# Patient Record
Sex: Female | Born: 1975
Health system: Southern US, Community
[De-identification: ages and names within clinical notes are randomized; demographics above are authoritative.]

## PROBLEM LIST (undated history)

## (undated) DIAGNOSIS — R011 Cardiac murmur, unspecified: Secondary | ICD-10-CM

## (undated) DIAGNOSIS — F32A Depression, unspecified: Secondary | ICD-10-CM

## (undated) DIAGNOSIS — K760 Fatty (change of) liver, not elsewhere classified: Secondary | ICD-10-CM

## (undated) DIAGNOSIS — F319 Bipolar disorder, unspecified: Secondary | ICD-10-CM

## (undated) DIAGNOSIS — F4310 Post-traumatic stress disorder, unspecified: Secondary | ICD-10-CM

## (undated) DIAGNOSIS — F329 Major depressive disorder, single episode, unspecified: Secondary | ICD-10-CM

---
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
## 2004-08-16 ENCOUNTER — Emergency Department (HOSPITAL_COMMUNITY): Admission: EM | Admit: 2004-08-16 | Discharge: 2004-08-17 | Payer: Self-pay | Admitting: Emergency Medicine

## 2004-08-18 ENCOUNTER — Emergency Department (HOSPITAL_COMMUNITY): Admission: EM | Admit: 2004-08-18 | Discharge: 2004-08-18 | Payer: Self-pay | Admitting: Family Medicine

## 2004-09-13 ENCOUNTER — Emergency Department (HOSPITAL_COMMUNITY): Admission: EM | Admit: 2004-09-13 | Discharge: 2004-09-13 | Payer: Self-pay | Admitting: Emergency Medicine

## 2004-10-05 ENCOUNTER — Inpatient Hospital Stay (HOSPITAL_COMMUNITY): Admission: AD | Admit: 2004-10-05 | Discharge: 2004-10-05 | Payer: Self-pay | Admitting: Obstetrics and Gynecology

## 2004-10-11 ENCOUNTER — Inpatient Hospital Stay (HOSPITAL_COMMUNITY): Admission: AD | Admit: 2004-10-11 | Discharge: 2004-10-11 | Payer: Self-pay | Admitting: Obstetrics & Gynecology

## 2004-12-16 ENCOUNTER — Emergency Department (HOSPITAL_COMMUNITY): Admission: EM | Admit: 2004-12-16 | Discharge: 2004-12-16 | Payer: Self-pay | Admitting: Emergency Medicine

## 2005-09-12 ENCOUNTER — Emergency Department (HOSPITAL_COMMUNITY): Admission: EM | Admit: 2005-09-12 | Discharge: 2005-09-12 | Payer: Self-pay | Admitting: Emergency Medicine

## 2005-09-27 ENCOUNTER — Other Ambulatory Visit: Admission: RE | Admit: 2005-09-27 | Discharge: 2005-09-27 | Payer: Self-pay | Admitting: Obstetrics and Gynecology

## 2005-12-06 ENCOUNTER — Ambulatory Visit (HOSPITAL_COMMUNITY): Admission: RE | Admit: 2005-12-06 | Discharge: 2005-12-06 | Payer: Self-pay | Admitting: Obstetrics and Gynecology

## 2006-02-08 ENCOUNTER — Ambulatory Visit: Payer: Self-pay

## 2006-02-11 IMAGING — US US OB TRANSVAGINAL MODIFY
1 series · 18 of 28 positions shown · non-contrast
Comparison: none

<!--  IDXRADR:ADDEND:BEGIN -->Addendum Begins<!--  IDXRADR:ADDEND:INNER_BEGIN -->DUPICATE COPY for exam association in [facility].  No change to original report ? 10/11/04

 <!--  IDXRADR:ADDEND:INNER_END -->Addendum Ends
<!--  IDXRADR:ADDEND:END -->Clinical Data:    28-year-old female, approximately 6 weeks pregnant with vaginal spotting.
 TRANSABDOMINAL AND TRANSVAGINAL PELVIC ULTRASOUND:
 No comparisons.
 Within the uterus, there is a gestational sac with a mean sac diameter of 16.3 mm, correlating with a 6 week 4 day pregnancy.  There is a visualized yolk sac.  Amnion is visualized.  No embryo or fetal pole is visualized at this point.  No subchorionic hemorrhage.  The right ovary is normal with a corpus luteum cyst.  The left ovary is unremarkable.  There is a small amount of free fluid.  
 By last menstrual period, the gestational age is estimated at approximately 9 weeks 3 days.

[Series 1: us ob comp<14 wk · 18 of 49 slices shown]
[im 1/49]
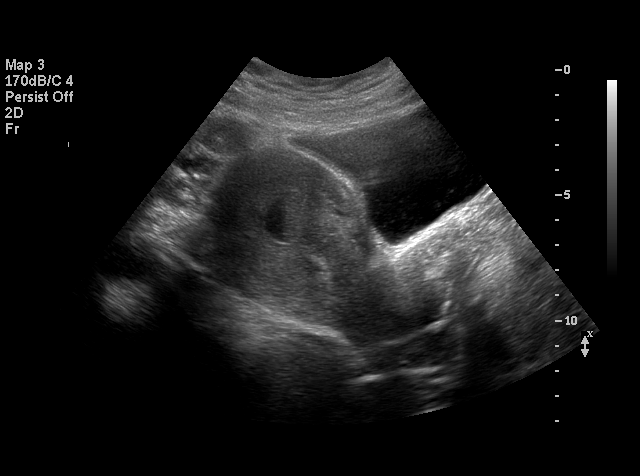
[im 4/49]
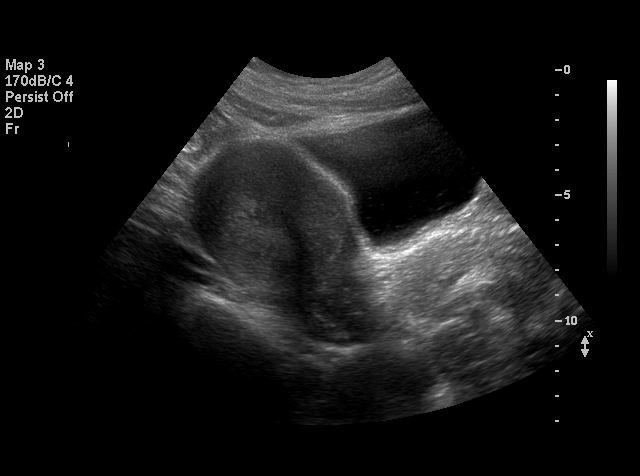
[im 6/49]
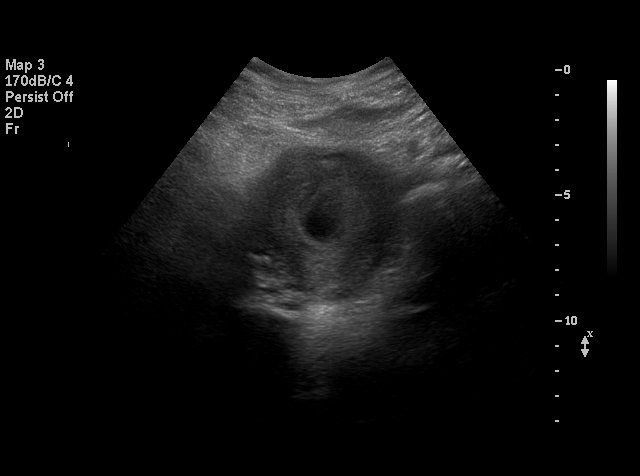
[im 9/49]
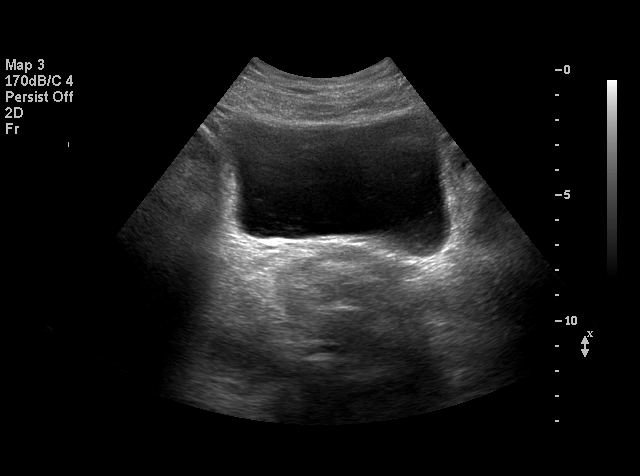
[im 13/49]
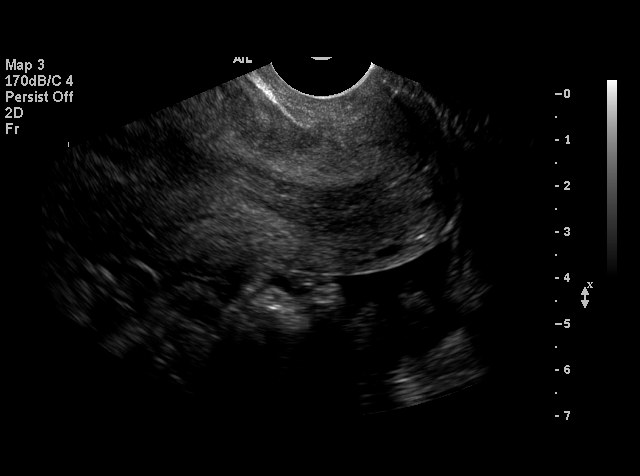
[im 15/49]
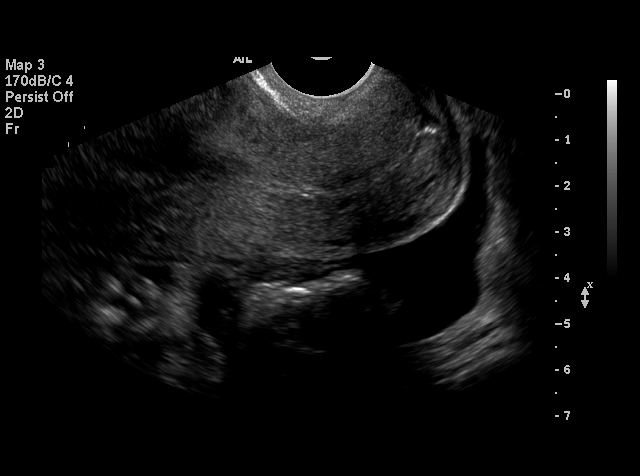
[im 18/49]
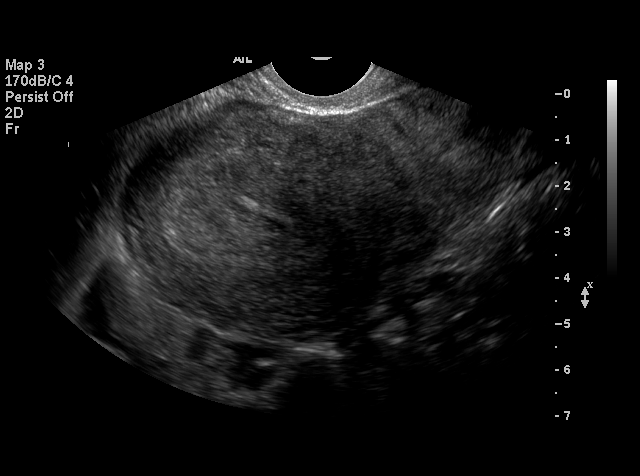
[im 20/49]
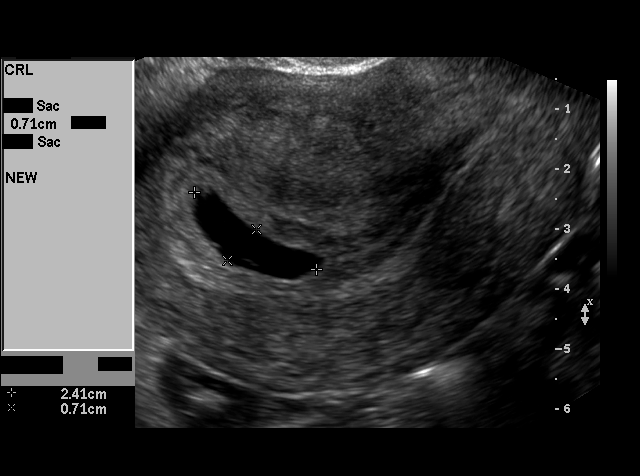
[im 24/49]
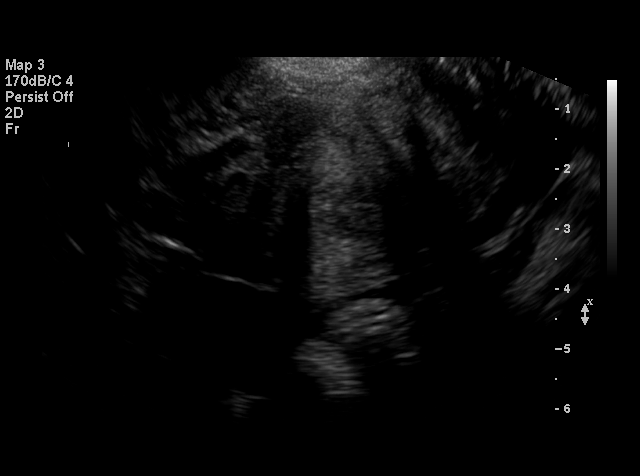
[im 25/49]
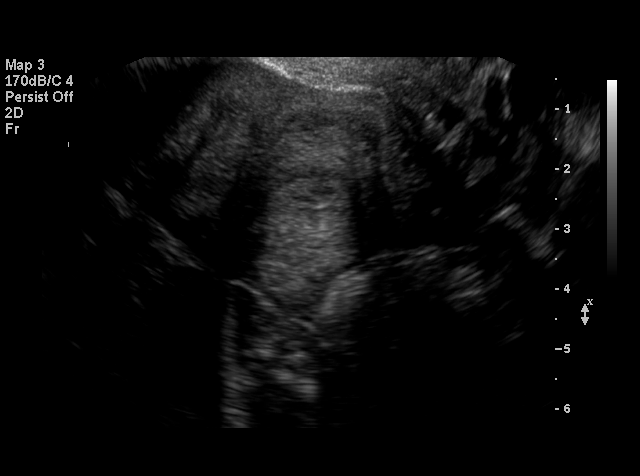
[im 29/49]
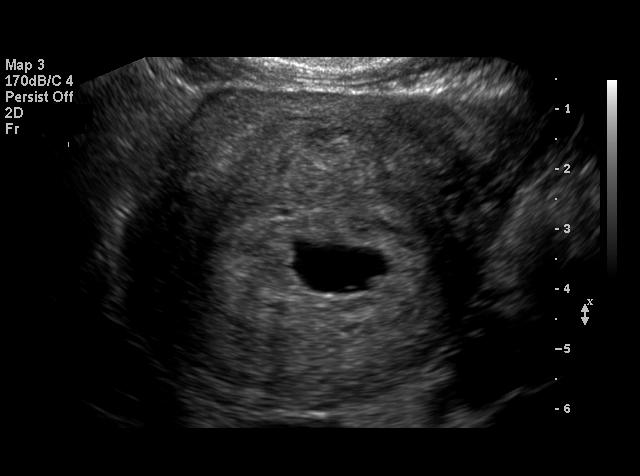
[im 31/49]
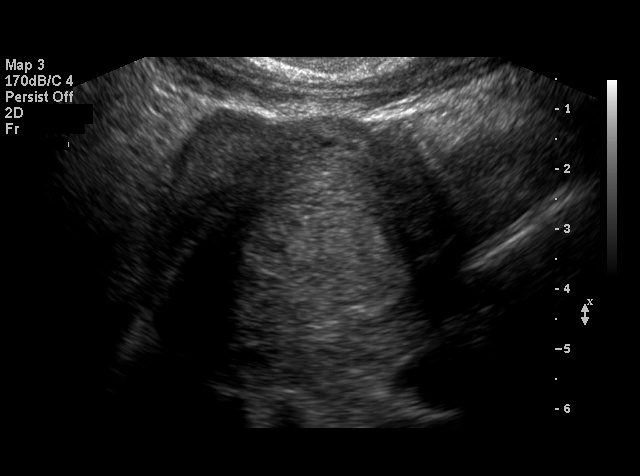
[im 34/49]
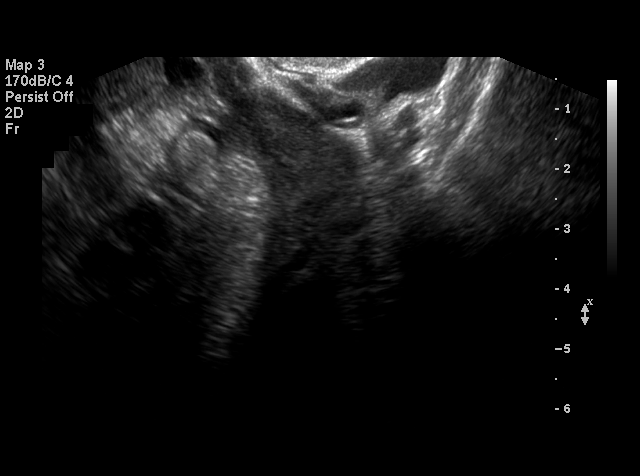
[im 38/49]
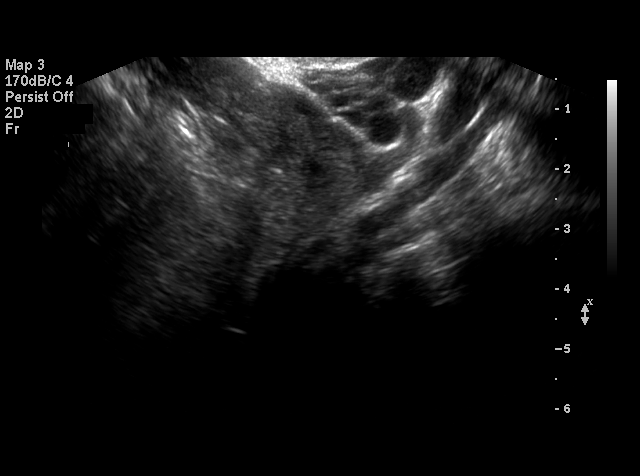
[im 40/49]
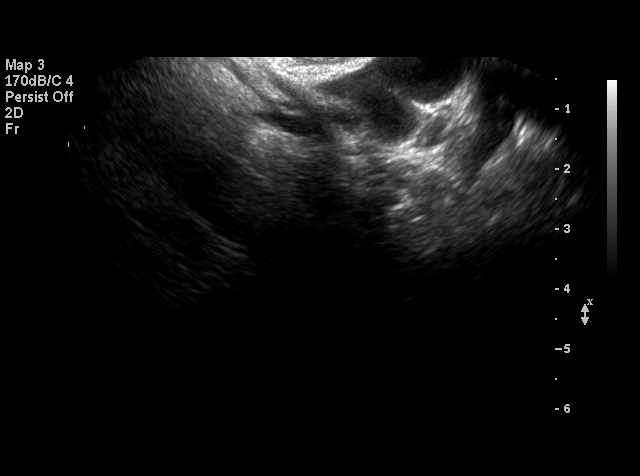
[im 43/49]
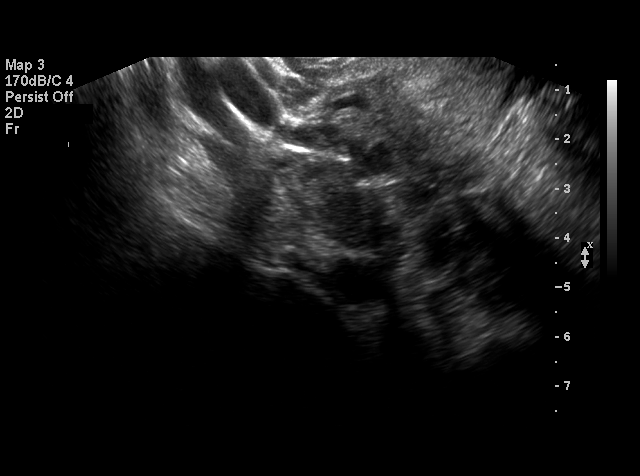
[im 45/49]
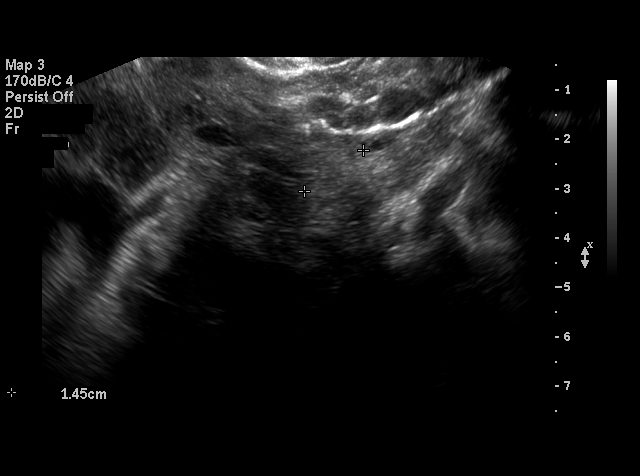
[im 49/49]
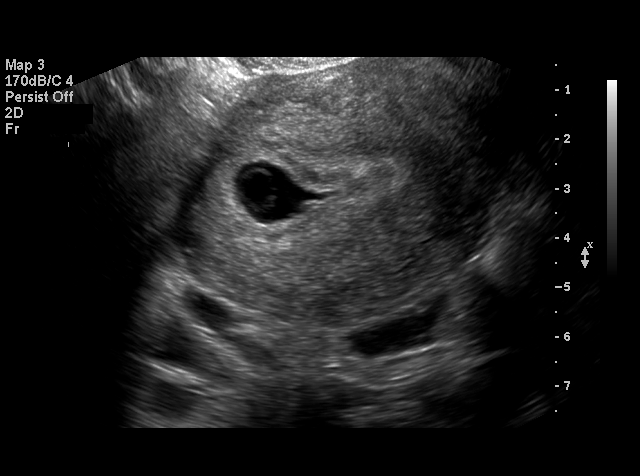

[18 of 28 positions shown; findings below may reference images not displayed]

IMPRESSION: 6 week 4 day gestational age by mean sac diameter with a visible yolk sac and amnion but no definite fetal pole or embryo at this point.  With the history of vaginal spotting, a follow-up exam is warranted to document progression of the pregnancy and development of a fetal pole.

## 2006-03-11 ENCOUNTER — Inpatient Hospital Stay (HOSPITAL_COMMUNITY): Admission: AD | Admit: 2006-03-11 | Discharge: 2006-03-11 | Payer: Self-pay | Admitting: Obstetrics and Gynecology

## 2006-03-20 ENCOUNTER — Inpatient Hospital Stay (HOSPITAL_COMMUNITY): Admission: AD | Admit: 2006-03-20 | Discharge: 2006-03-22 | Payer: Self-pay | Admitting: Obstetrics and Gynecology

## 2010-12-11 ENCOUNTER — Encounter: Payer: Self-pay | Admitting: Obstetrics and Gynecology

## 2010-12-11 ENCOUNTER — Encounter: Payer: Self-pay | Admitting: *Deleted

## 2011-06-20 ENCOUNTER — Emergency Department (HOSPITAL_COMMUNITY)
Admission: EM | Admit: 2011-06-20 | Discharge: 2011-06-21 | Disposition: A | Discharge status: Home / Self Care | Payer: Medicaid Other | Attending: Emergency Medicine | Admitting: Emergency Medicine

## 2011-06-20 DIAGNOSIS — R10814 Left lower quadrant abdominal tenderness: Secondary | ICD-10-CM | POA: Insufficient documentation

## 2011-06-20 DIAGNOSIS — IMO0001 Reserved for inherently not codable concepts without codable children: Secondary | ICD-10-CM | POA: Insufficient documentation

## 2011-06-20 DIAGNOSIS — R109 Unspecified abdominal pain: Secondary | ICD-10-CM | POA: Insufficient documentation

## 2011-06-20 DIAGNOSIS — F411 Generalized anxiety disorder: Secondary | ICD-10-CM | POA: Insufficient documentation

## 2011-06-21 LAB — URINALYSIS, ROUTINE W REFLEX MICROSCOPIC
Glucose, UA: NEGATIVE mg/dL
Hgb urine dipstick: NEGATIVE
Leukocytes, UA: NEGATIVE
Nitrite: NEGATIVE
Protein, ur: NEGATIVE mg/dL
Specific Gravity, Urine: 1.028 (ref 1.005–1.030)
Urobilinogen, UA: 1 mg/dL (ref 0.0–1.0)
pH: 5.5 (ref 5.0–8.0)

## 2011-06-21 LAB — POCT PREGNANCY, URINE: Preg Test, Ur: NEGATIVE

## 2012-01-20 ENCOUNTER — Other Ambulatory Visit (HOSPITAL_COMMUNITY)
Admission: RE | Admit: 2012-01-20 | Discharge: 2012-01-20 | Disposition: A | Discharge status: Home / Self Care | Payer: Medicaid Other | Source: Ambulatory Visit | Attending: Internal Medicine | Admitting: Internal Medicine

## 2012-01-20 ENCOUNTER — Other Ambulatory Visit: Payer: Self-pay | Admitting: Family Medicine

## 2012-01-20 DIAGNOSIS — Z01419 Encounter for gynecological examination (general) (routine) without abnormal findings: Secondary | ICD-10-CM | POA: Insufficient documentation

## 2015-12-23 ENCOUNTER — Emergency Department (HOSPITAL_COMMUNITY)
Admission: EM | Admit: 2015-12-23 | Discharge: 2015-12-23 | Disposition: A | Discharge status: Home / Self Care | Payer: Medicaid Other | Attending: Emergency Medicine | Admitting: Emergency Medicine

## 2015-12-23 ENCOUNTER — Encounter (HOSPITAL_COMMUNITY): Payer: Self-pay | Admitting: Emergency Medicine

## 2015-12-23 DIAGNOSIS — Z7982 Long term (current) use of aspirin: Secondary | ICD-10-CM | POA: Diagnosis not present

## 2015-12-23 DIAGNOSIS — R21 Rash and other nonspecific skin eruption: Secondary | ICD-10-CM | POA: Diagnosis not present

## 2015-12-23 DIAGNOSIS — F17210 Nicotine dependence, cigarettes, uncomplicated: Secondary | ICD-10-CM | POA: Diagnosis not present

## 2015-12-23 DIAGNOSIS — R51 Headache: Secondary | ICD-10-CM | POA: Insufficient documentation

## 2015-12-23 DIAGNOSIS — Z79899 Other long term (current) drug therapy: Secondary | ICD-10-CM | POA: Insufficient documentation

## 2015-12-23 DIAGNOSIS — M6248 Contracture of muscle, other site: Secondary | ICD-10-CM | POA: Diagnosis not present

## 2015-12-23 DIAGNOSIS — M62830 Muscle spasm of back: Secondary | ICD-10-CM | POA: Diagnosis not present

## 2015-12-23 DIAGNOSIS — M5442 Lumbago with sciatica, left side: Secondary | ICD-10-CM | POA: Diagnosis not present

## 2015-12-23 DIAGNOSIS — M542 Cervicalgia: Secondary | ICD-10-CM | POA: Insufficient documentation

## 2015-12-23 DIAGNOSIS — G44209 Tension-type headache, unspecified, not intractable: Secondary | ICD-10-CM

## 2015-12-23 DIAGNOSIS — Z8659 Personal history of other mental and behavioral disorders: Secondary | ICD-10-CM | POA: Insufficient documentation

## 2015-12-23 DIAGNOSIS — Z791 Long term (current) use of non-steroidal anti-inflammatories (NSAID): Secondary | ICD-10-CM | POA: Insufficient documentation

## 2015-12-23 DIAGNOSIS — M545 Low back pain: Secondary | ICD-10-CM | POA: Diagnosis present

## 2015-12-23 HISTORY — DX: Post-traumatic stress disorder, unspecified: F43.10

## 2015-12-23 MED ORDER — CYCLOBENZAPRINE HCL 10 MG PO TABS
10.0000 mg | ORAL_TABLET | Freq: Once | ORAL | Status: AC
  Administered 2015-12-23: 10 mg via ORAL
  Filled 2015-12-23: qty 1

## 2015-12-23 MED ORDER — PREDNISONE 20 MG PO TABS
60.0000 mg | ORAL_TABLET | Freq: Once | ORAL | Status: AC
  Administered 2015-12-23: 60 mg via ORAL
  Filled 2015-12-23: qty 3

## 2015-12-23 MED ORDER — NAPROXEN 500 MG PO TABS
500.0000 mg | ORAL_TABLET | Freq: Two times a day (BID) | ORAL | Status: DC
Start: 2015-12-23 — End: 2019-06-28

## 2015-12-23 MED ORDER — OXYCODONE-ACETAMINOPHEN 5-325 MG PO TABS
1.0000 | ORAL_TABLET | Freq: Once | ORAL | Status: AC
  Administered 2015-12-23: 1 via ORAL
  Filled 2015-12-23: qty 1

## 2015-12-23 MED ORDER — IBUPROFEN 800 MG PO TABS
800.0000 mg | ORAL_TABLET | Freq: Once | ORAL | Status: AC
  Administered 2015-12-23: 800 mg via ORAL
  Filled 2015-12-23: qty 1

## 2015-12-23 MED ORDER — PREDNISONE 50 MG PO TABS: 50.0000 mg | ORAL_TABLET | Freq: Every day | ORAL | Status: DC

## 2015-12-23 MED ORDER — ORPHENADRINE CITRATE ER 100 MG PO TB12
100.0000 mg | ORAL_TABLET | Freq: Two times a day (BID) | ORAL | Status: DC
Start: 2015-12-23 — End: 2019-06-28

## 2015-12-23 MED ORDER — OXYCODONE-ACETAMINOPHEN 5-325 MG PO TABS: 1.0000 | ORAL_TABLET | ORAL | Status: DC | PRN

## 2015-12-23 NOTE — ED Provider Notes (Signed)
CSN: [id number]     Arrival date & time 12/23/15  1745 History   First MD Initiated Contact with Patient 12/23/15 2135     Chief Complaint  Patient presents with  . Back Pain  . Headache     (Consider location/radiation/quality/duration/timing/severity/associated sxs/prior Treatment) Patient is a 40 y.o. female presenting with back pain and headaches. The history is provided by the patient.  Back Pain Associated symptoms: headaches   Headache Associated symptoms: back pain   She comes in with a rash and back pain and neck pain for the last 6 months. Symptoms of been gradually getting worse. Pain starts in the neck and goes all way down to her lower back and also radiates to her left leg. Pain is constant but will wax and wane. It is worse when she is up and moving. There is some temporary relief with applying heat or cold. No weakness, numbness, tingling. No bowel or bladder dysfunction. Headache has been present pretty much the same time that the pain is been present. Over the same period, she has had a rash which is very pruritic. It is present most everywhere. It will be present in one location and then faded only to reappear in another location. Worst areas include the neck and around her ears. She currently rates her pain at 6/10.  Past Medical History  Diagnosis Date  . PTSD (post-traumatic stress disorder)    History reviewed. No pertinent past surgical history. No family history on file. Social History  Substance Use Topics  . Smoking status: Current Every Day Smoker -- 0.50 packs/day    Types: Cigarettes  . Smokeless tobacco: None  . Alcohol Use: No   OB History    No data available     Review of Systems  Musculoskeletal: Positive for back pain.  Neurological: Positive for headaches.  All other systems reviewed and are negative.     Allergies  Mushroom extract complex  Home Medications   Prior to Admission medications   Medication Sig Start Date End Date  Taking? Authorizing Provider  ARIPiprazole (ABILIFY) 5 MG tablet Take 5-10 mg by mouth 2 (two) times daily. 11/17/15  Yes Historical Provider, MD  aspirin 325 MG EC tablet Take 325 mg by mouth every 6 (six) hours as needed for pain.   Yes Historical Provider, MD  gabapentin (NEURONTIN) 300 MG capsule Take 300 mg by mouth 3 (three) times daily. 11/17/15  Yes Historical Provider, MD  meloxicam (MOBIC) 15 MG tablet Take 15 mg by mouth daily. 11/17/15  Yes Historical Provider, MD  Multiple Vitamin (MULTIVITAMIN WITH MINERALS) TABS tablet Take 1 tablet by mouth daily.   Yes Historical Provider, MD  NUVARING 0.12-0.015 MG/24HR vaginal ring Take 1 application by mouth See admin instructions. 3 weeks on, 1 week off 11/17/15  Yes Historical Provider, MD  simvastatin (ZOCOR) 20 MG tablet Take 20 mg by mouth every evening. 10/29/15  Yes Historical Provider, MD   BP 104/61 mmHg  Pulse 63  Temp(Src) 99.4 F (37.4 C) (Oral)  Resp 16  SpO2 100%  LMP 12/23/2015 (Exact Date) Physical Exam  Nursing note and vitals reviewed.  40 year old female, resting comfortably and in no acute distress. Vital signs are normal. Oxygen saturation is 100%, which is normal. Head is normocephalic and atraumatic. PERRLA, EOMI. Oropharynx is clear. Neck is tender in the mid and upper cervical region with significant tenderness of the paracervical muscles and insertion of the paracervical muscles and the scalp. There is no  adenopathy or JVD. Back is mildly tender diffusely with worse tenderness in the lower lumbar area. There is moderate bilateral paralumbar spasm. Straight leg raise is positive on the left 45. There is no CVA tenderness. Lungs are clear without rales, wheezes, or rhonchi. Chest is nontender. Heart has regular rate and rhythm without murmur. Abdomen is soft, flat, nontender without masses or hepatosplenomegaly and peristalsis is normoactive. Extremities have no cyanosis or edema, full range of motion is  present. Skin: Rash is present over most areas of the body. Some areas are papules which appear to be mildly excoriated. There are areas of dermal thickening with scaling that look almost like areas of psoriasis. This is primarily around the neck in ears. Overall appearance is are nonspecific.Marland Kitchen Neurologic: Mental status is normal, cranial nerves are intact, there are no motor or sensory deficits.  ED Course  Procedures (including critical care time)  MDM   Final diagnoses:  Rash  Neck pain  Muscle contraction headache  Bilateral low back pain with left-sided sciatica    Back and neck pain which appear to be musculoskeletal. No red flanks to suggest more serious conditions. Also, chronicity favors more benign causes. Rash of uncertain cause. Certainly, certain areas appeared to be psoriatic. However, no nail changes to suggest psoriatic arthritis. She is referred to dermatology and referred back to her primary care provider. She is given prescriptions for naproxen, orphenadrine, prednisone, oxycodone-acetaminophen. Old records are reviewed and she has no relevant past visits.    Dione Booze, MD 12/24/15 [phone]

## 2015-12-23 NOTE — ED Notes (Signed)
MD made aware pt would like to see MD.

## 2015-12-23 NOTE — ED Notes (Signed)
Pt walked out into hall stating she needed to leave within 45 minutes. Dr. Preston Fleeting made aware.

## 2015-12-23 NOTE — ED Notes (Signed)
Pt to triage door inquiring about wait, updated pt on status at this time

## 2015-12-23 NOTE — ED Notes (Signed)
Pt reports back pain x 6 months which as worsened today. Pt reports back pain radiates up her back and down her left leg causing headaches. Pt also reports generalized rash from unknown cause. Pt alert x4. NAD at this time.

## 2015-12-23 NOTE — Discharge Instructions (Signed)
Rash A rash is a change in the color or texture of the skin. There are many different types of rashes. You may have other problems that accompany your rash. CAUSES   Infections.  Allergic reactions. This can include allergies to pets or foods.  Certain medicines.  Exposure to certain chemicals, soaps, or cosmetics.  Heat.  Exposure to poisonous plants.  Tumors, both cancerous and noncancerous. SYMPTOMS   Redness.  Scaly skin.  Itchy skin.  Dry or cracked skin.  Bumps.  Blisters.  Pain. DIAGNOSIS  Your caregiver may do a physical exam to determine what type of rash you have. A skin sample (biopsy) may be taken and examined under a microscope. TREATMENT  Treatment depends on the type of rash you have. Your caregiver may prescribe certain medicines. For serious conditions, you may need to see a skin doctor (dermatologist). HOME CARE INSTRUCTIONS   Avoid the substance that caused your rash.  Do not scratch your rash. This can cause infection.  You may take cool baths to help stop itching.  Only take over-the-counter or prescription medicines as directed by your caregiver.  Keep all follow-up appointments as directed by your caregiver. SEEK IMMEDIATE MEDICAL CARE IF:  You have increasing pain, swelling, or redness.  You have a fever.  You have new or severe symptoms.  You have body aches, diarrhea, or vomiting.  Your rash is not better after 3 days. MAKE SURE YOU:  Understand these instructions.  Will watch your condition.  Will get help right away if you are not doing well or get worse.   This information is not intended to replace advice given to you by your health care provider. Make sure you discuss any questions you have with your health care provider.   Document Released: 10/28/2002 Document Revised: 11/28/2014 Document Reviewed: 03/25/2015 Elsevier Interactive Patient Education 2016 [employer]  Back Pain, Adult Back pain is very common in  adults.The cause of back pain is rarely dangerous and the pain often gets better over time.The cause of your back pain may not be known. Some common causes of back pain include:  Strain of the muscles or ligaments supporting the spine.  Wear and tear (degeneration) of the spinal disks.  Arthritis.  Direct injury to the back. For many people, back pain may return. Since back pain is rarely dangerous, most people can learn to manage this condition on their own. HOME CARE INSTRUCTIONS Watch your back pain for any changes. The following actions may help to lessen any discomfort you are feeling:  Remain active. It is stressful on your back to sit or stand in one place for long periods of time. Do not sit, drive, or stand in one place for more than 30 minutes at a time. Take short walks on even surfaces as soon as you are able.Try to increase the length of time you walk each day.  Exercise regularly as directed by your health care provider. Exercise helps your back heal faster. It also helps avoid future injury by keeping your muscles strong and flexible.  Do not stay in bed.Resting more than 1-2 days can delay your recovery.  Pay attention to your body when you bend and lift. The most comfortable positions are those that put less stress on your recovering back. Always use proper lifting techniques, including:  Bending your knees.  Keeping the load close to your body.  Avoiding twisting.  Find a comfortable position to sleep. Use a firm mattress and lie on your  side with your knees slightly bent. If you lie on your back, put a pillow under your knees.  Avoid feeling anxious or stressed.Stress increases muscle tension and can worsen back pain.It is important to recognize when you are anxious or stressed and learn ways to manage it, such as with exercise.  Take medicines only as directed by your health care provider. Over-the-counter medicines to reduce pain and inflammation are often  the most helpful.Your health care provider may prescribe muscle relaxant drugs.These medicines help dull your pain so you can more quickly return to your normal activities and healthy exercise.  Apply ice to the injured area:  Put ice in a plastic bag.  Place a towel between your skin and the bag.  Leave the ice on for 20 minutes, 2-3 times a day for the first 2-3 days. After that, ice and heat may be alternated to reduce pain and spasms.  Maintain a healthy weight. Excess weight puts extra stress on your back and makes it difficult to maintain good posture. SEEK MEDICAL CARE IF:  You have pain that is not relieved with rest or medicine.  You have increasing pain going down into the legs or buttocks.  You have pain that does not improve in one week.  You have night pain.  You lose weight.  You have a fever or chills. SEEK IMMEDIATE MEDICAL CARE IF:   You develop new bowel or bladder control problems.  You have unusual weakness or numbness in your arms or legs.  You develop nausea or vomiting.  You develop abdominal pain.  You feel faint.   This information is not intended to replace advice given to you by your health care provider. Make sure you discuss any questions you have with your health care provider.   Document Released: 11/07/2005 Document Revised: 11/28/2014 Document Reviewed: 03/11/2014 Elsevier Interactive Patient Education 2016 [employer].  Tension Headache A tension headache is a feeling of pain, pressure, or aching that is often felt over the front and sides of the head. The pain can be dull, or it can feel tight (constricting). Tension headaches are not normally associated with nausea or vomiting, and they do not get worse with physical activity. Tension headaches can last from 30 minutes to several days. This is the most common type of headache. CAUSES The exact cause of this condition is not known. Tension headaches often begin after stress, anxiety,  or depression. Other triggers may include:  Alcohol.  Too much caffeine, or caffeine withdrawal.  Respiratory infections, such as colds, flu, or sinus infections.  Dental problems or teeth clenching.  Fatigue.  Holding your head and neck in the same position for a long period of time, such as while using a computer.  Smoking. SYMPTOMS Symptoms of this condition include:  A feeling of pressure around the head.  Dull, aching head pain.  Pain felt over the front and sides of the head.  Tenderness in the muscles of the head, neck, and shoulders. DIAGNOSIS This condition may be diagnosed based on your symptoms and a physical exam. Tests may be done, such as a CT scan or an MRI of your head. These tests may be done if your symptoms are severe or unusual. TREATMENT This condition may be treated with lifestyle changes and medicines to help relieve symptoms. HOME CARE INSTRUCTIONS Managing Pain  Take over-the-counter and prescription medicines only as told by your health care provider.  Lie down in a dark, quiet room when you have a  headache.  If directed, apply ice to the head and neck area:  Put ice in a plastic bag.  Place a towel between your skin and the bag.  Leave the ice on for 20 minutes, 2-3 times per day.  Use a heating pad or a hot shower to apply heat to the head and neck area as told by your health care provider. Eating and Drinking  Eat meals on a regular schedule.  Limit alcohol use.  Decrease your caffeine intake, or stop using caffeine. General Instructions  Keep all follow-up visits as told by your health care provider. This is important.  Keep a headache journal to help find out what may trigger your headaches. For example, write down:  What you eat and drink.  How much sleep you get.  Any change to your diet or medicines.  Try massage or other relaxation techniques.  Limit stress.  Sit up straight, and avoid tensing your muscles.  Do  not use tobacco products, including cigarettes, chewing tobacco, or e-cigarettes. If you need help quitting, ask your health care provider.  Exercise regularly as told by your health care provider.  Get 7-9 hours of sleep, or the amount recommended by your health care provider. SEEK MEDICAL CARE IF:  Your symptoms are not helped by medicine.  You have a headache that is different from what you normally experience.  You have nausea or you vomit.  You have a fever. SEEK IMMEDIATE MEDICAL CARE IF:  Your headache becomes severe.  You have repeated vomiting.  You have a stiff neck.  You have a loss of vision.  You have problems with speech.  You have pain in your eye or ear.  You have muscular weakness or loss of muscle control.  You lose your balance or you have trouble walking.  You feel faint or you pass out.  You have confusion.   This information is not intended to replace advice given to you by your health care provider. Make sure you discuss any questions you have with your health care provider.   Document Released: 11/07/2005 Document Revised: 07/29/2015 Document Reviewed: 03/02/2015 Elsevier Interactive Patient Education 2016 [employer].  Naproxen and naproxen sodium oral immediate-release tablets What is this medicine? NAPROXEN (na PROX en) is a non-steroidal anti-inflammatory drug (NSAID). It is used to reduce swelling and to treat pain. This medicine may be used for dental pain, headache, or painful monthly periods. It is also used for painful joint and muscular problems such as arthritis, tendinitis, bursitis, and gout. This medicine may be used for other purposes; ask your health care provider or pharmacist if you have questions. What should I tell my health care provider before I take this medicine? They need to know if you have any of these conditions: -asthma -cigarette smoker -drink more than 3 alcohol containing drinks a day -heart disease or  circulation problems such as heart failure or leg edema (fluid retention) -high blood pressure -kidney disease -liver disease -stomach bleeding or ulcers -an unusual or allergic reaction to naproxen, aspirin, other NSAIDs, other medicines, foods, dyes, or preservatives -pregnant or trying to get pregnant -breast-feeding How should I use this medicine? Take this medicine by mouth with a glass of water. Follow the directions on the prescription label. Take it with food if your stomach gets upset. Try to not lie down for at least 10 minutes after you take it. Take your medicine at regular intervals. Do not take your medicine more often than directed. Long-term,  continuous use may increase the risk of heart attack or stroke. A special MedGuide will be given to you by the pharmacist with each prescription and refill. Be sure to read this information carefully each time. Talk to your pediatrician regarding the use of this medicine in children. Special care may be needed. Overdosage: If you think you have taken too much of this medicine contact a poison control center or emergency room at once. NOTE: This medicine is only for you. Do not share this medicine with others. What if I miss a dose? If you miss a dose, take it as soon as you can. If it is almost time for your next dose, take only that dose. Do not take double or extra doses. What may interact with this medicine? -alcohol -aspirin -cidofovir -diuretics -lithium -methotrexate -other drugs for inflammation like ketorolac or prednisone -pemetrexed -probenecid -warfarin This list may not describe all possible interactions. Give your health care provider a list of all the medicines, herbs, non-prescription drugs, or dietary supplements you use. Also tell them if you smoke, drink alcohol, or use illegal drugs. Some items may interact with your medicine. What should I watch for while using this medicine? Tell your doctor or health care  professional if your pain does not get better. Talk to your doctor before taking another medicine for pain. Do not treat yourself. This medicine does not prevent heart attack or stroke. In fact, this medicine may increase the chance of a heart attack or stroke. The chance may increase with longer use of this medicine and in people who have heart disease. If you take aspirin to prevent heart attack or stroke, talk with your doctor or health care professional. Do not take other medicines that contain aspirin, ibuprofen, or naproxen with this medicine. Side effects such as stomach upset, nausea, or ulcers may be more likely to occur. Many medicines available without a prescription should not be taken with this medicine. This medicine can cause ulcers and bleeding in the stomach and intestines at any time during treatment. Do not smoke cigarettes or drink alcohol. These increase irritation to your stomach and can make it more susceptible to damage from this medicine. Ulcers and bleeding can happen without warning symptoms and can cause death. You may get drowsy or dizzy. Do not drive, use machinery, or do anything that needs mental alertness until you know how this medicine affects you. Do not stand or sit up quickly, especially if you are an older patient. This reduces the risk of dizzy or fainting spells. This medicine can cause you to bleed more easily. Try to avoid damage to your teeth and gums when you brush or floss your teeth. What side effects may I notice from receiving this medicine? Side effects that you should report to your doctor or health care professional as soon as possible: -black or bloody stools, blood in the urine or vomit -blurred vision -chest pain -difficulty breathing or wheezing -nausea or vomiting -severe stomach pain -skin rash, skin redness, blistering or peeling skin, hives, or itching -slurred speech or weakness on one side of the body -swelling of eyelids, throat,  lips -unexplained weight gain or swelling -unusually weak or tired -yellowing of eyes or skin Side effects that usually do not require medical attention (report to your doctor or health care professional if they continue or are bothersome): -constipation -headache -heartburn This list may not describe all possible side effects. Call your doctor for medical advice about side effects. You may report  side effects to FDA at 1-800-FDA-1088. Where should I keep my medicine? Keep out of the reach of children. Store at room temperature between 15 and 30 degrees C (59 and 86 degrees F). Keep container tightly closed. Throw away any unused medicine after the expiration date. NOTE: This sheet is a summary. It may not cover all possible information. If you have questions about this medicine, talk to your doctor, pharmacist, or health care provider.    2016, Elsevier/[employer] Standard. (2009-11-09 20:10:16)  Orphenadrine tablets What is this medicine? ORPHENADRINE (or FEN a dreen) helps to relieve pain and stiffness in muscles and can treat muscle spasms. This medicine may be used for other purposes; ask your health care provider or pharmacist if you have questions. What should I tell my health care provider before I take this medicine? They need to know if you have any of these conditions: -glaucoma -heart disease -kidney disease -myasthenia gravis -peptic ulcer disease -prostate disease -stomach problems -an unusual or allergic reaction to orphenadrine, other medicines, foods, lactose, dyes, or preservatives -pregnant or trying to get pregnant -breast-feeding How should I use this medicine? Take this medicine by mouth with a full glass of water. Follow the directions on the prescription label. Take your medicine at regular intervals. Do not take your medicine more often than directed. Do not take more than you are told to take. Talk to your pediatrician regarding the use of this medicine in  children. Special care may be needed. Patients over 92 years old may have a stronger reaction and need a smaller dose. Overdosage: If you think you have taken too much of this medicine contact a poison control center or emergency room at once. NOTE: This medicine is only for you. Do not share this medicine with others. What if I miss a dose? If you miss a dose, take it as soon as you can. If it is almost time for your next dose, take only that dose. Do not take double or extra doses. What may interact with this medicine? -alcohol -antihistamines -barbiturates, like phenobarbital -benzodiazepines -cyclobenzaprine -medicines for pain -phenothiazines like chlorpromazine, mesoridazine, prochlorperazine, thioridazine This list may not describe all possible interactions. Give your health care provider a list of all the medicines, herbs, non-prescription drugs, or dietary supplements you use. Also tell them if you smoke, drink alcohol, or use illegal drugs. Some items may interact with your medicine. What should I watch for while using this medicine? Your mouth may get dry. Chewing sugarless gum or sucking hard candy, and drinking plenty of water may help. Contact your doctor if the problem does not go away or is severe. This medicine may cause dry eyes and blurred vision. If you wear contact lenses you may feel some discomfort. Lubricating drops may help. See your eye doctor if the problem does not go away or is severe. You may get drowsy or dizzy. Do not drive, use machinery, or do anything that needs mental alertness until you know how this medicine affects you. Do not stand or sit up quickly, especially if you are an older patient. This reduces the risk of dizzy or fainting spells. Alcohol may interfere with the effect of this medicine. Avoid alcoholic drinks. What side effects may I notice from receiving this medicine? Side effects that you should report to your doctor or health care professional  as soon as possible: -allergic reactions like skin rash, itching or hives, swelling of the face, lips, or tongue -changes in vision -difficulty breathing -fast heartbeat  or palpitations -hallucinations -light headedness, fainting spells -vomiting Side effects that usually do not require medical attention (report to your doctor or health care professional if they continue or are bothersome): -dizziness -drowsiness -headache -nausea This list may not describe all possible side effects. Call your doctor for medical advice about side effects. You may report side effects to FDA at 1-800-FDA-1088. Where should I keep my medicine? Keep out of the reach of children. This medicine may cause accidental overdose and death if it taken by other adults, children, or pets. Mix any unused medicine with a substance like cat litter or coffee grounds. Then throw the medicine away in a sealed container like a sealed bag or a coffee can with a lid. Do not use the medicine after the expiration date. Store at room temperature between 15 and 30 degrees C (59 and 86 degrees F). NOTE: This sheet is a summary. It may not cover all possible information. If you have questions about this medicine, talk to your doctor, pharmacist, or health care provider.    2016, Elsevier/[employer] Standard. (2014-01-03 15:35:08)  Prednisone tablets What is this medicine? PREDNISONE (PRED ni sone) is a corticosteroid. It is commonly used to treat inflammation of the skin, joints, lungs, and other organs. Common conditions treated include asthma, allergies, and arthritis. It is also used for other conditions, such as blood disorders and diseases of the adrenal glands. This medicine may be used for other purposes; ask your health care provider or pharmacist if you have questions. What should I tell my health care provider before I take this medicine? They need to know if you have any of these conditions: -Cushing's  syndrome -diabetes -glaucoma -heart disease -high blood pressure -infection (especially a virus infection such as chickenpox, cold sores, or herpes) -kidney disease -liver disease -mental illness -myasthenia gravis -osteoporosis -seizures -stomach or intestine problems -thyroid disease -an unusual or allergic reaction to lactose, prednisone, other medicines, foods, dyes, or preservatives -pregnant or trying to get pregnant -breast-feeding How should I use this medicine? Take this medicine by mouth with a glass of water. Follow the directions on the prescription label. Take this medicine with food. If you are taking this medicine once a day, take it in the morning. Do not take more medicine than you are told to take. Do not suddenly stop taking your medicine because you may develop a severe reaction. Your doctor will tell you how much medicine to take. If your doctor wants you to stop the medicine, the dose may be slowly lowered over time to avoid any side effects. Talk to your pediatrician regarding the use of this medicine in children. Special care may be needed. Overdosage: If you think you have taken too much of this medicine contact a poison control center or emergency room at once. NOTE: This medicine is only for you. Do not share this medicine with others. What if I miss a dose? If you miss a dose, take it as soon as you can. If it is almost time for your next dose, talk to your doctor or health care professional. You may need to miss a dose or take an extra dose. Do not take double or extra doses without advice. What may interact with this medicine? Do not take this medicine with any of the following medications: -metyrapone -mifepristone This medicine may also interact with the following medications: -aminoglutethimide -amphotericin B -aspirin and aspirin-like medicines -barbiturates -certain medicines for diabetes, like glipizide or  glyburide -cholestyramine -cholinesterase inhibitors -cyclosporine -digoxin -  diuretics -ephedrine -female hormones, like estrogens and birth control pills -isoniazid -ketoconazole -NSAIDS, medicines for pain and inflammation, like ibuprofen or naproxen -phenytoin -rifampin -toxoids -vaccines -warfarin This list may not describe all possible interactions. Give your health care provider a list of all the medicines, herbs, non-prescription drugs, or dietary supplements you use. Also tell them if you smoke, drink alcohol, or use illegal drugs. Some items may interact with your medicine. What should I watch for while using this medicine? Visit your doctor or health care professional for regular checks on your progress. If you are taking this medicine over a prolonged period, carry an identification card with your name and address, the type and dose of your medicine, and your doctor's name and address. This medicine may increase your risk of getting an infection. Tell your doctor or health care professional if you are around anyone with measles or chickenpox, or if you develop sores or blisters that do not heal properly. If you are going to have surgery, tell your doctor or health care professional that you have taken this medicine within the last twelve months. Ask your doctor or health care professional about your diet. You may need to lower the amount of salt you eat. This medicine may affect blood sugar levels. If you have diabetes, check with your doctor or health care professional before you change your diet or the dose of your diabetic medicine. What side effects may I notice from receiving this medicine? Side effects that you should report to your doctor or health care professional as soon as possible: -allergic reactions like skin rash, itching or hives, swelling of the face, lips, or tongue -changes in emotions or moods -changes in vision -depressed mood -eye pain -fever or chills,  cough, sore throat, pain or difficulty passing urine -increased thirst -swelling of ankles, feet Side effects that usually do not require medical attention (report to your doctor or health care professional if they continue or are bothersome): -confusion, excitement, restlessness -headache -nausea, vomiting -skin problems, acne, thin and shiny skin -trouble sleeping -weight gain This list may not describe all possible side effects. Call your doctor for medical advice about side effects. You may report side effects to FDA at 1-800-FDA-1088. Where should I keep my medicine? Keep out of the reach of children. Store at room temperature between 15 and 30 degrees C (59 and 86 degrees F). Protect from light. Keep container tightly closed. Throw away any unused medicine after the expiration date. NOTE: This sheet is a summary. It may not cover all possible information. If you have questions about this medicine, talk to your doctor, pharmacist, or health care provider.    2016, [employer] Standard. (2011-06-23 10:57:14)  Acetaminophen; Oxycodone tablets What is this medicine? ACETAMINOPHEN; OXYCODONE (a set a MEE noe fen; ox i KOE done) is a pain reliever. It is used to treat moderate to severe pain. This medicine may be used for other purposes; ask your health care provider or pharmacist if you have questions. What should I tell my health care provider before I take this medicine? They need to know if you have any of these conditions: -brain tumor -Crohn's disease, inflammatory bowel disease, or ulcerative colitis -drug abuse or addiction -head injury -heart or circulation problems -if you often drink alcohol -kidney disease or problems going to the bathroom -liver disease -lung disease, asthma, or breathing problems -an unusual or allergic reaction to acetaminophen, oxycodone, other opioid analgesics, other medicines, foods, dyes, or preservatives -pregnant or trying to  get  pregnant -breast-feeding How should I use this medicine? Take this medicine by mouth with a full glass of water. Follow the directions on the prescription label. You can take it with or without food. If it upsets your stomach, take it with food. Take your medicine at regular intervals. Do not take it more often than directed. Talk to your pediatrician regarding the use of this medicine in children. Special care may be needed. Patients over 23 years old may have a stronger reaction and need a smaller dose. Overdosage: If you think you have taken too much of this medicine contact a poison control center or emergency room at once. NOTE: This medicine is only for you. Do not share this medicine with others. What if I miss a dose? If you miss a dose, take it as soon as you can. If it is almost time for your next dose, take only that dose. Do not take double or extra doses. What may interact with this medicine? -alcohol -antihistamines -barbiturates like amobarbital, butalbital, butabarbital, methohexital, pentobarbital, phenobarbital, thiopental, and secobarbital -benztropine -drugs for bladder problems like solifenacin, trospium, oxybutynin, tolterodine, hyoscyamine, and methscopolamine -drugs for breathing problems like ipratropium and tiotropium -drugs for certain stomach or intestine problems like propantheline, homatropine methylbromide, glycopyrrolate, atropine, belladonna, and dicyclomine -general anesthetics like etomidate, ketamine, nitrous oxide, propofol, desflurane, enflurane, halothane, isoflurane, and sevoflurane -medicines for depression, anxiety, or psychotic disturbances -medicines for sleep -muscle relaxants -naltrexone -narcotic medicines (opiates) for pain -phenothiazines like perphenazine, thioridazine, chlorpromazine, mesoridazine, fluphenazine, prochlorperazine, promazine, and trifluoperazine -scopolamine -tramadol -trihexyphenidyl This list may not describe all possible  interactions. Give your health care provider a list of all the medicines, herbs, non-prescription drugs, or dietary supplements you use. Also tell them if you smoke, drink alcohol, or use illegal drugs. Some items may interact with your medicine. What should I watch for while using this medicine? Tell your doctor or health care professional if your pain does not go away, if it gets worse, or if you have new or a different type of pain. You may develop tolerance to the medicine. Tolerance means that you will need a higher dose of the medication for pain relief. Tolerance is normal and is expected if you take this medicine for a long time. Do not suddenly stop taking your medicine because you may develop a severe reaction. Your body becomes used to the medicine. This does NOT mean you are addicted. Addiction is a behavior related to getting and using a drug for a non-medical reason. If you have pain, you have a medical reason to take pain medicine. Your doctor will tell you how much medicine to take. If your doctor wants you to stop the medicine, the dose will be slowly lowered over time to avoid any side effects. You may get drowsy or dizzy. Do not drive, use machinery, or do anything that needs mental alertness until you know how this medicine affects you. Do not stand or sit up quickly, especially if you are an older patient. This reduces the risk of dizzy or fainting spells. Alcohol may interfere with the effect of this medicine. Avoid alcoholic drinks. There are different types of narcotic medicines (opiates) for pain. If you take more than one type at the same time, you may have more side effects. Give your health care provider a list of all medicines you use. Your doctor will tell you how much medicine to take. Do not take more medicine than directed. Call emergency for help if you  have problems breathing. The medicine will cause constipation. Try to have a bowel movement at least every 2 to 3 days. If  you do not have a bowel movement for 3 days, call your doctor or health care professional. Do not take Tylenol (acetaminophen) or medicines that have acetaminophen with this medicine. Too much acetaminophen can be very dangerous. Many nonprescription medicines contain acetaminophen. Always read the labels carefully to avoid taking more acetaminophen. What side effects may I notice from receiving this medicine? Side effects that you should report to your doctor or health care professional as soon as possible: -allergic reactions like skin rash, itching or hives, swelling of the face, lips, or tongue -breathing difficulties, wheezing -confusion -light headedness or fainting spells -severe stomach pain -unusually weak or tired -yellowing of the skin or the whites of the eyes Side effects that usually do not require medical attention (report to your doctor or health care professional if they continue or are bothersome): -dizziness -drowsiness -nausea -vomiting This list may not describe all possible side effects. Call your doctor for medical advice about side effects. You may report side effects to FDA at 1-800-FDA-1088. Where should I keep my medicine? Keep out of the reach of children. This medicine can be abused. Keep your medicine in a safe place to protect it from theft. Do not share this medicine with anyone. Selling or giving away this medicine is dangerous and against the law. This medicine may cause accidental overdose and death if it taken by other adults, children, or pets. Mix any unused medicine with a substance like cat litter or coffee grounds. Then throw the medicine away in a sealed container like a sealed bag or a coffee can with a lid. Do not use the medicine after the expiration date. Store at room temperature between 20 and 25 degrees C (68 and 77 degrees F). NOTE: This sheet is a summary. It may not cover all possible information. If you have questions about this medicine, talk  to your doctor, pharmacist, or health care provider.    2016, Elsevier/Gold Standard. (2014-10-08 15:18:46)

## 2015-12-24 ENCOUNTER — Telehealth: Payer: Self-pay | Admitting: *Deleted

## 2018-04-05 MED FILL — ALPRAZolam 1 MG TABS: 1 | 30 days supply | Qty: 90 | Fill #0

## 2018-04-06 MED FILL — traZODone HCL 100 MG TABS: 100 | 30 days supply | Qty: 30 | Fill #0

## 2018-04-06 MED FILL — risperiDONE 2 MG TABS: 2 | 30 days supply | Qty: 30 | Fill #0

## 2018-04-06 MED FILL — PRAZOSIN 2 MG CAPSULE: 2 | 30 days supply | Qty: 60 | Fill #0

## 2018-08-10 MED FILL — traZODone HCL 100 MG TABS: 100 | 90 days supply | Qty: 90 | Fill #0

## 2018-08-10 MED FILL — PRAZOSIN 2 MG CAPSULE: 2 | 30 days supply | Qty: 60 | Fill #1

## 2019-06-26 ENCOUNTER — Emergency Department (HOSPITAL_COMMUNITY)
Admission: EM | Admit: 2019-06-26 | Discharge: 2019-06-27 | Disposition: A | Discharge status: Other Acute Inpatient Hospital | Payer: Medicaid - Out of State | Attending: Emergency Medicine | Admitting: Emergency Medicine

## 2019-06-26 ENCOUNTER — Other Ambulatory Visit: Payer: Self-pay

## 2019-06-26 ENCOUNTER — Encounter (HOSPITAL_COMMUNITY): Payer: Self-pay | Admitting: Emergency Medicine

## 2019-06-26 DIAGNOSIS — F14950 Cocaine use, unspecified with cocaine-induced psychotic disorder with delusions: Secondary | ICD-10-CM

## 2019-06-26 DIAGNOSIS — Z79899 Other long term (current) drug therapy: Secondary | ICD-10-CM | POA: Insufficient documentation

## 2019-06-26 DIAGNOSIS — F17210 Nicotine dependence, cigarettes, uncomplicated: Secondary | ICD-10-CM | POA: Diagnosis not present

## 2019-06-26 DIAGNOSIS — R45851 Suicidal ideations: Secondary | ICD-10-CM | POA: Insufficient documentation

## 2019-06-26 DIAGNOSIS — Z20828 Contact with and (suspected) exposure to other viral communicable diseases: Secondary | ICD-10-CM | POA: Diagnosis not present

## 2019-06-26 DIAGNOSIS — Z7982 Long term (current) use of aspirin: Secondary | ICD-10-CM | POA: Insufficient documentation

## 2019-06-26 DIAGNOSIS — Z91018 Allergy to other foods: Secondary | ICD-10-CM | POA: Insufficient documentation

## 2019-06-26 DIAGNOSIS — F1490 Cocaine use, unspecified, uncomplicated: Secondary | ICD-10-CM | POA: Diagnosis not present

## 2019-06-26 DIAGNOSIS — F14150 Cocaine abuse with cocaine-induced psychotic disorder with delusions: Secondary | ICD-10-CM

## 2019-06-26 DIAGNOSIS — F329 Major depressive disorder, single episode, unspecified: Secondary | ICD-10-CM | POA: Insufficient documentation

## 2019-06-26 DIAGNOSIS — F22 Delusional disorders: Secondary | ICD-10-CM

## 2019-06-26 LAB — COMPREHENSIVE METABOLIC PANEL
ALT: 44 U/L (ref 0–44)
AST: 38 U/L (ref 15–41)
Albumin: 3.9 g/dL (ref 3.5–5.0)
Alkaline Phosphatase: 57 U/L (ref 38–126)
Anion gap: 10 (ref 5–15)
BUN: 9 mg/dL (ref 6–20)
CO2: 23 mmol/L (ref 22–32)
Calcium: 8.9 mg/dL (ref 8.9–10.3)
Chloride: 107 mmol/L (ref 98–111)
Creatinine, Ser: 0.63 mg/dL (ref 0.44–1.00)
GFR calc Af Amer: >60 mL/min (ref >60)
GFR calc non Af Amer: >60 mL/min (ref >60)
Glucose, Bld: 93 mg/dL (ref 70–99)
Potassium: 3.9 mmol/L (ref 3.5–5.1)
Sodium: 140 mmol/L (ref 135–145)
Total Bilirubin: 0.8 mg/dL (ref 0.3–1.2)
Total Protein: 6.9 g/dL (ref 6.5–8.1)

## 2019-06-26 LAB — RAPID URINE DRUG SCREEN, HOSP PERFORMED
Amphetamines: POSITIVE — AB
Barbiturates: NOT DETECTED
Benzodiazepines: NOT DETECTED
Cocaine: POSITIVE — AB
Opiates: NOT DETECTED
Tetrahydrocannabinol: POSITIVE — AB

## 2019-06-26 LAB — SALICYLATE LEVEL: Salicylate Lvl: <7 mg/dL (ref 2.8–30.0)

## 2019-06-26 LAB — CBC
HCT: 37.7 % (ref 36.0–46.0)
Hemoglobin: 11.6 g/dL — ABNORMAL LOW (ref 12.0–15.0)
MCH: 26.8 pg (ref 26.0–34.0)
MCHC: 30.8 g/dL (ref 30.0–36.0)
MCV: 87.1 fL (ref 80.0–100.0)
Platelets: 226 10*3/uL (ref 150–400)
RBC: 4.33 MIL/uL (ref 3.87–5.11)
RDW: 13.5 % (ref 11.5–15.5)
WBC: 5.9 10*3/uL (ref 4.0–10.5)
nRBC: 0 % (ref 0.0–0.2)

## 2019-06-26 LAB — ACETAMINOPHEN LEVEL: Acetaminophen (Tylenol), Serum: <10 ug/mL — ABNORMAL LOW (ref 10–30)

## 2019-06-26 LAB — PREGNANCY, URINE: Preg Test, Ur: NEGATIVE

## 2019-06-26 LAB — ETHANOL: Alcohol, Ethyl (B): <10 mg/dL (ref <10)

## 2019-06-26 MED ORDER — IBUPROFEN 200 MG PO TABS
400.0000 mg | ORAL_TABLET | Freq: Once | ORAL | Status: AC
  Administered 2019-06-26: 22:00:00 400 mg via ORAL
  Filled 2019-06-26: qty 2

## 2019-06-26 MED ORDER — LORAZEPAM 2 MG/ML IJ SOLN: 2.0000 mg | Freq: Once | INTRAMUSCULAR | Status: DC

## 2019-06-26 NOTE — ED Notes (Signed)
Requested she be tested for HIV and STDs. Requested test for her and order put in for HIV and chlamydia. Called lab and no blood available for HIV test, will need to have phlebotomy come down.

## 2019-06-26 NOTE — ED Triage Notes (Signed)
Patient IVC'd due to drinking bleach, beating herself with her hands and fists. Patient states she drunk bleach to kill the bugs in her blood" , " I need to cut my legs off to get rid of these bugs".

## 2019-06-26 NOTE — ED Notes (Signed)
Brought large suitcase and white hospital bag unchecked and put in dayroom on the acute unit.

## 2019-06-26 NOTE — ED Notes (Signed)
Collected chlamydia swab test, patient is on her menses

## 2019-06-26 NOTE — ED Notes (Signed)
Calm, reports pain is less since taking Ibuprofen. Rates her pain as a 5. Writer did not give her the IM Ativan as she has not required it, she has remained calm and not a behavior issue. She is loud at times but not aggressive.

## 2019-06-26 NOTE — ED Provider Notes (Addendum)
[facility]-EMERGENCY DEPT Provider Note   CSN: [id number] Arrival date & time: 06/26/19  1935    History   Chief Complaint Chief Complaint  Patient presents with  . IVC    HPI Christella NoaMakia K Barthelemy is a 43 y.o. female.     Patient presents via [employer] with IVC papers. Per IVC papers, taken out by family member, patient was reported that while in [place], she occasionally drank bleach to kill the bugs inside of her. Patient currently denies thinking there are bugs inside of her, and states that is something she did when was living in [place]. States she currently is visiting here, and has been in [place] for only the past 3 days. Patient denies any thoughts of harm to self or others. Denies etoh or substance abuse. States she feels fine, no abd pain. No nv. Normal appetite.   The history is provided by the patient and the police.    Past Medical History:  Diagnosis Date  . PTSD (post-traumatic stress disorder)     There are no active problems to display for this patient.   History reviewed. No pertinent surgical history.   OB History   No obstetric history on file.      Home Medications    Prior to Admission medications   Medication Sig Start Date End Date Taking? Authorizing Provider  ALPRAZolam Prudy Feeler(XANAX) 1 MG tablet Take 1 mg by mouth at bedtime as needed for anxiety.   Yes [provider]  traMADol (ULTRAM) 50 MG tablet Take 50 mg by mouth every 6 (six) hours as needed for severe pain.   Yes [provider]  naproxen (NAPROSYN) 500 MG tablet Take 1 tablet (500 mg total) by mouth 2 (two) times daily. Patient not taking: Reported on 06/26/2019 12/23/15   Dione BoozeGlick, David, MD  orphenadrine (NORFLEX) 100 MG tablet Take 1 tablet (100 mg total) by mouth 2 (two) times daily. Patient not taking: Reported on 06/26/2019 12/23/15   Dione BoozeGlick, David, MD  oxyCODONE-acetaminophen (PERCOCET) 5-325 MG tablet Take 1 tablet by mouth every 4 (four) hours as needed for  moderate pain. Patient not taking: Reported on 06/26/2019 12/23/15   Dione BoozeGlick, David, MD  predniSONE (DELTASONE) 50 MG tablet Take 1 tablet (50 mg total) by mouth daily. Patient not taking: Reported on 06/26/2019 12/23/15   Dione BoozeGlick, David, MD    Family History History reviewed. No pertinent family history.  Social History Social History   Tobacco Use  . Smoking status: Current Every Day Smoker    Packs/day: 0.50    Types: Cigarettes  . Smokeless tobacco: Never Used  Substance Use Topics  . Alcohol use: No  . Drug use: No     Allergies   Mushroom extract complex   Review of Systems Review of Systems  Constitutional: Negative for fever.  HENT: Negative for sore throat.   Eyes: Negative for redness.  Respiratory: Negative for cough and shortness of breath.   Cardiovascular: Negative for chest pain.  Gastrointestinal: Negative for abdominal pain, diarrhea and vomiting.  Genitourinary: Negative for flank pain.  Musculoskeletal: Negative for back pain and neck pain.  Skin: Negative for rash.  Neurological: Negative for headaches.  Hematological: Does not bruise/bleed easily.  Psychiatric/Behavioral: Negative for suicidal ideas.     Physical Exam Updated Vital Signs BP 127/62 (BP Location: Right Arm)   Pulse (!) 59   Temp 99.6 F (37.6 C) (Axillary)   Resp 18   SpO2 100%   Physical Exam Vitals signs  and nursing note reviewed.  Constitutional:      Appearance: Normal appearance. She is well-developed.  HENT:     Head: Atraumatic.     Nose: Nose normal.     Mouth/Throat:     Mouth: Mucous membranes are moist.  Eyes:     General: No scleral icterus.    Conjunctiva/sclera: Conjunctivae normal.     Pupils: Pupils are equal, round, and reactive to light.  Neck:     Musculoskeletal: Normal range of motion and neck supple. No neck rigidity or muscular tenderness.     Trachea: No tracheal deviation.  Cardiovascular:     Rate and Rhythm: Normal rate and regular rhythm.      Pulses: Normal pulses.     Heart sounds: Normal heart sounds. No murmur. No friction rub. No gallop.   Pulmonary:     Effort: Pulmonary effort is normal. No respiratory distress.     Breath sounds: Normal breath sounds.  Abdominal:     General: Bowel sounds are normal. There is no distension.     Palpations: Abdomen is soft.     Tenderness: There is no abdominal tenderness. There is no guarding.  Genitourinary:    Comments: No cva tenderness.  Musculoskeletal:        General: No swelling.  Skin:    General: Skin is warm and dry.     Findings: No rash.  Neurological:     Mental Status: She is alert.     Comments: Alert, speech normal. Steady gait.   Psychiatric:     Comments: Normal mood/affect, denies SI/HI.       ED Treatments / Results  Labs (all labs ordered are listed, but only abnormal results are displayed) Results for orders placed or performed during the hospital encounter of 06/26/19  CBC  Result Value Ref Range   WBC 5.9 4.0 - 10.5 K/uL   RBC 4.33 3.87 - 5.11 MIL/uL   Hemoglobin 11.6 (L) 12.0 - 15.0 g/dL   HCT 37.7 36.0 - 46.0 %   MCV 87.1 80.0 - 100.0 fL   MCH 26.8 26.0 - 34.0 pg   MCHC 30.8 30.0 - 36.0 g/dL   RDW 13.5 11.5 - 15.5 %   Platelets 226 150 - 400 K/uL   nRBC 0.0 0.0 - 0.2 %  Comprehensive metabolic panel  Result Value Ref Range   Sodium 140 135 - 145 mmol/L   Potassium 3.9 3.5 - 5.1 mmol/L   Chloride 107 98 - 111 mmol/L   CO2 23 22 - 32 mmol/L   Glucose, Bld 93 70 - 99 mg/dL   BUN 9 6 - 20 mg/dL   Creatinine, Ser 0.63 0.44 - 1.00 mg/dL   Calcium 8.9 8.9 - 10.3 mg/dL   Total Protein 6.9 6.5 - 8.1 g/dL   Albumin 3.9 3.5 - 5.0 g/dL   AST 38 15 - 41 U/L   ALT 44 0 - 44 U/L   Alkaline Phosphatase 57 38 - 126 U/L   Total Bilirubin 0.8 0.3 - 1.2 mg/dL   GFR calc non Af Amer >60 >60 mL/min   GFR calc Af Amer >60 >60 mL/min   Anion gap 10 5 - 15  Ethanol  Result Value Ref Range   Alcohol, Ethyl (B) <10 <10 mg/dL  Acetaminophen level   Result Value Ref Range   Acetaminophen (Tylenol), Serum <10 (L) 10 - 30 ug/mL  Salicylate level  Result Value Ref Range   Salicylate Lvl <1.6 2.8 -  30.0 mg/dL  Pregnancy, urine  Result Value Ref Range   Preg Test, Ur NEGATIVE NEGATIVE    EKG None  Radiology No results found.  Procedures Procedures (including critical care time)  Medications Ordered in ED Medications  LORazepam (ATIVAN) injection 2 mg (has no administration in time range)  ibuprofen (ADVIL) tablet 400 mg (has no administration in time range)     Initial Impression / Assessment and Plan / ED Course  I have reviewed the triage vital signs and the nursing notes.  Pertinent labs & imaging results that were available during my care of the patient were reviewed by me and considered in my medical decision making (see chart for details).  Labs sent.  BH team consulted.   Reviewed nursing notes and prior charts for additional history.   Labs reviewed by me -  Chem normal. Acet/sal neg.   Pts RN requests med anxiety, to help calm pt. Pt notes hx anxiety. Appears very stress/anxious. Ativan im.   BH evaluation pending.   [facility]    Final Clinical Impressions(s) / ED Diagnoses   Final diagnoses:  None    ED Discharge Orders    None         Cathren LaineSteinl, Mahlon Gabrielle, MD 06/26/19 2146

## 2019-06-26 NOTE — ED Notes (Signed)
Triage transferred patient to bed 32 for ongoing monitoring for need and safety. She is IVC per her mom for unusual behavior. Per mom she drank bleach to get rid of bugs in her blood. She asked officer to tell staff to do a sonogram of her legs and a HIV test. She is agitated but once fed and vitals collected she is calmer now. She has been informed we need a urine on her and cup at bedside.

## 2019-06-26 NOTE — BH Assessment (Signed)
Tele Assessment Note   Patient Name: Diane Saunders MRN: [id number] Referring Physician: Dr. Cathren LaineKevin Steinl [facility] Location of Provider: Behavioral Health TTS Department  Diane Saunders is an 43 y.o. female presenting under IVC due to delusional thinking. 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 Patient was reported that while in [place], she occasionally drank bleach to kill the bugs inside of her. Patient currently denies thinking there are bugs inside of her, and states that is something she did when was living in [place]. States she currently is visiting here, and has been in [place] for only the past 3 days. Patient denies any thoughts of harm to self or others. Patient denied past suicide attempts and prior inpatient mental health treatment. Patient reported history with [facility], nothing recent. Patient reported needing medication management and housing. Patient reported the bugs in her skin calm down when the temperature is cool, but when the temperature is warm or hot that the bugs are active. Patient showed marks on skin where she felt holes were that bugs were coming and going into her skin. Patient was pleasant and cooperative during assessment.   PER IVC Respondent is cutting her self, drinking bleach, beating herself with her hands and fists, respondent stated "I drank bleach to kill the bugs in my blood", "I need to cut my legs off to get rid of these bugs". Respondent is a danger to herself and others.   Diagnosis: Major depressive disorder  Past Medical History:  Past Medical History:  Diagnosis Date  . PTSD (post-traumatic stress disorder)     History reviewed. No pertinent surgical history.  Family History: History reviewed. No pertinent family history.  Social History:  reports that she has been smoking cigarettes. She has been smoking about 0.50 packs per day. She has never used smokeless tobacco. She reports that she does not drink alcohol or use drugs.  Additional  Social History:  Alcohol / Drug Use Pain Medications: see MAR Prescriptions: see MAR Over the Counter: see MAR  CIWA:   COWS:    Allergies:  Allergies  Allergen Reactions  . Mushroom Extract Complex Itching    Home Medications: (Not in a hospital admission)   OB/GYN Status:  No LMP recorded.  General Assessment Data Location of Assessment: [facility] ED TTS Assessment: In system Is this a Tele or Face-to-Face Assessment?: Tele Assessment Is this an Initial Assessment or a Re-assessment for this encounter?: Initial Assessment Patient Accompanied by:: N/A Language Other than English: No Living Arrangements: (family home) What gender do you identify as?: Female Marital status: Single Pregnancy Status: Unknown Living Arrangements: Parent(mother) Can pt return to current living arrangement?: Yes Admission Status: Involuntary Petitioner: Family member Is patient capable of signing voluntary admission?: (IVC) Referral Source: Self/Family/Friend     Crisis Care Plan Living Arrangements: Parent(mother) Legal Guardian: (self) Name of Psychiatrist: (none) Name of Therapist: (none)  Education Status Is patient currently in school?: No Is the patient employed, unemployed or receiving disability?: Unemployed  Risk to self with the past 6 months Suicidal Ideation: No Has patient been a risk to self within the past 6 months prior to admission? : No Suicidal Intent: No Has patient had any suicidal intent within the past 6 months prior to admission? : No Is patient at risk for suicide?: No Suicidal Plan?: No Has patient had any suicidal plan within the past 6 months prior to admission? : No Access to Means: No What has been your use of drugs/alcohol within the last 12 months?: (  occassional marijuana) Previous Attempts/Gestures: No How many times?: (0) Other Self Harm Risks: (none reported) Triggers for Past Attempts: (n/a) Intentional Self Injurious Behavior: None Family Suicide  History: No Recent stressful life event(s): Other (Comment)(needs psych meds) Persecutory voices/beliefs?: No Depression: Yes Depression Symptoms: Loss of interest in usual pleasures, Guilt Substance abuse history and/or treatment for substance abuse?: No Suicide prevention information given to non-admitted patients: Not applicable  Risk to Others within the past 6 months Homicidal Ideation: No Does patient have any lifetime risk of violence toward others beyond the six months prior to admission? : No Thoughts of Harm to Others: No Current Homicidal Intent: No Current Homicidal Plan: No Access to Homicidal Means: No Identified Victim: (n/a) History of harm to others?: No Assessment of Violence: None Noted Violent Behavior Description: (none reported) Does patient have access to weapons?: No Criminal Charges Pending?: No Does patient have a court date: No Is patient on probation?: No  Psychosis Hallucinations: None noted Delusions: None noted  Mental Status Report Appearance/Hygiene: Unremarkable Eye Contact: Good Motor Activity: Freedom of movement Speech: Logical/coherent Level of Consciousness: Alert Mood: Anxious Affect: Anxious Anxiety Level: Moderate Thought Processes: Relevant Judgement: Partial Orientation: Person, Place, Time, Situation Obsessive Compulsive Thoughts/Behaviors: None  Cognitive Functioning Concentration: Good Memory: Recent Intact Is patient IDD: No Insight: Poor Impulse Control: Fair Appetite: Fair Have you had any weight changes? : No Change Sleep: No Change Total Hours of Sleep: (3) Vegetative Symptoms: None  ADLScreening [facility] Assessment Services) Patient's cognitive ability adequate to safely complete daily activities?: Yes Patient able to express need for assistance with ADLs?: Yes Independently performs ADLs?: Yes (appropriate for developmental age)  Prior Inpatient Therapy Prior Inpatient Therapy: No  Prior Outpatient  Therapy Prior Outpatient Therapy: Yes Prior Therapy Dates: (years ago) Prior Therapy Facilty/Provider(s): ([facility]o at [facility]) Reason for Treatment: (mental illness) Does patient have an ACCT team?: No Does patient have Intensive In-House Services?  : No Does patient have [facility] services? : No Does patient have P4CC services?: No  ADL Screening (condition at time of admission) Patient's cognitive ability adequate to safely complete daily activities?: Yes Patient able to express need for assistance with ADLs?: Yes Independently performs ADLs?: Yes (appropriate for developmental age)  Regulatory affairs officer (For Healthcare) Does Patient Have a Medical Advance Directive?: No Would patient like information on creating a medical advance directive?: No - Patient declined  Disposition:  Disposition Initial Assessment Completed for this Encounter: Yes  Anette Riedel, NP, patient meets inpatient criteria. TTS to secure placement.   This service was provided via telemedicine using a 2-way, interactive audio and video technology.  Names of all persons participating in this telemedicine service and their role in this encounter. Name: Dangela How Role: Patient  Name: Kirtland Bouchard Role: TTS Clinician  Name:  Role:   Name:  Role:     Venora Maples 06/26/2019 9:13 PM

## 2019-06-27 ENCOUNTER — Observation Stay (HOSPITAL_COMMUNITY)
Admission: AD | Admit: 2019-06-27 | Discharge: 2019-06-28 | Disposition: A | Discharge status: Home / Self Care | Payer: Medicaid - Out of State | Attending: Psychiatry | Admitting: Psychiatry

## 2019-06-27 DIAGNOSIS — F17210 Nicotine dependence, cigarettes, uncomplicated: Secondary | ICD-10-CM | POA: Diagnosis not present

## 2019-06-27 DIAGNOSIS — F22 Delusional disorders: Secondary | ICD-10-CM | POA: Diagnosis present

## 2019-06-27 DIAGNOSIS — F4310 Post-traumatic stress disorder, unspecified: Secondary | ICD-10-CM | POA: Insufficient documentation

## 2019-06-27 DIAGNOSIS — F1290 Cannabis use, unspecified, uncomplicated: Secondary | ICD-10-CM | POA: Insufficient documentation

## 2019-06-27 DIAGNOSIS — F14150 Cocaine abuse with cocaine-induced psychotic disorder with delusions: Secondary | ICD-10-CM | POA: Insufficient documentation

## 2019-06-27 DIAGNOSIS — F319 Bipolar disorder, unspecified: Principal | ICD-10-CM | POA: Diagnosis present

## 2019-06-27 DIAGNOSIS — F1590 Other stimulant use, unspecified, uncomplicated: Secondary | ICD-10-CM | POA: Insufficient documentation

## 2019-06-27 HISTORY — DX: Bipolar disorder, unspecified: F31.9

## 2019-06-27 HISTORY — DX: Fatty (change of) liver, not elsewhere classified: K76.0

## 2019-06-27 HISTORY — DX: Depression, unspecified: F32.A

## 2019-06-27 HISTORY — DX: Cardiac murmur, unspecified: R01.1

## 2019-06-27 LAB — SARS CORONAVIRUS 2 (TAT 6-24 HRS): SARS Coronavirus 2: NEGATIVE

## 2019-06-27 MED ORDER — LORAZEPAM 1 MG PO TABS
2.0000 mg | ORAL_TABLET | Freq: Once | ORAL | Status: AC
  Administered 2019-06-27: 01:00:00 2 mg via ORAL
  Filled 2019-06-27: qty 2

## 2019-06-27 MED ORDER — GABAPENTIN 300 MG PO CAPS
300.0000 mg | ORAL_CAPSULE | Freq: Two times a day (BID) | ORAL | Status: DC
  Administered 2019-06-28: 10:00:00 300 mg via ORAL
  Filled 2019-06-27: qty 1

## 2019-06-27 MED ORDER — ACETAMINOPHEN 325 MG PO TABS: 650.0000 mg | ORAL_TABLET | Freq: Four times a day (QID) | ORAL | Status: DC | PRN

## 2019-06-27 MED ORDER — HYDROXYZINE HCL 25 MG PO TABS: 25.0000 mg | ORAL_TABLET | Freq: Three times a day (TID) | ORAL | Status: DC | PRN

## 2019-06-27 MED ORDER — GABAPENTIN 300 MG PO CAPS
300.0000 mg | ORAL_CAPSULE | Freq: Two times a day (BID) | ORAL | Status: DC
  Administered 2019-06-27 (×2): 300 mg via ORAL
  Filled 2019-06-27 (×2): qty 1

## 2019-06-27 MED ORDER — TRAZODONE HCL 50 MG PO TABS
50.0000 mg | ORAL_TABLET | Freq: Every evening | ORAL | Status: DC | PRN
  Administered 2019-06-27: 50 mg via ORAL
  Filled 2019-06-27: qty 1

## 2019-06-27 MED ORDER — ALUM & MAG HYDROXIDE-SIMETH 200-200-20 MG/5ML PO SUSP: 30.0000 mL | ORAL | Status: DC | PRN

## 2019-06-27 MED ORDER — RISPERIDONE 0.5 MG PO TABS
0.5000 mg | ORAL_TABLET | Freq: Two times a day (BID) | ORAL | Status: DC
  Administered 2019-06-27 (×2): 0.5 mg via ORAL
  Filled 2019-06-27 (×2): qty 1

## 2019-06-27 MED ORDER — MAGNESIUM HYDROXIDE 400 MG/5ML PO SUSP: 30.0000 mL | Freq: Every day | ORAL | Status: DC | PRN

## 2019-06-27 MED ORDER — RISPERIDONE 0.5 MG PO TABS
0.5000 mg | ORAL_TABLET | Freq: Two times a day (BID) | ORAL | Status: DC
  Administered 2019-06-28: 0.5 mg via ORAL
  Filled 2019-06-27: qty 1

## 2019-06-27 NOTE — ED Notes (Signed)
Still no result on COVID test, called [facility] lab and it was sent to [facility] to process as it was not a rapid test. Called [facility] lab but didn't get an answer, will try again. Covid test is holding up her move to [facility] at [facility].

## 2019-06-27 NOTE — Progress Notes (Signed)
CSW received consult for patient to assist with housing resources and for patient to switch her Medicaid from [place] back to [place]. Patient reports she moved to [place] in January with hopes of finding more opportunities, but was unsuccessful. She lived in [place] 15 years prior to moving.   CSW explained that there are no shelters that have bed availability for women at this time. CSW gave patient list of shelters. CSW also provided patient with contact information to DSS to switch her Medicaid. Patient reports she can also follow up with [facility] for housing and her medications. She has been a patient there in the past. CSW put [place] contact information on her AVS.   No other social work needs reported. CSW signing off, please consult for any other needs.   [place], LCSW Transitions of Care Department [facility] ED [phone]

## 2019-06-27 NOTE — ED Notes (Signed)
Continues to be awake and busy. Washed her hair and self in bedroom sink. States she thinks she has lice but there is no evidence of that. She has many requests of her tech ie vaseline for her hair, lotion for her legs, has requested food multiple times and it has been given to her. She is appropriate but busy esp for 0100. Consulted Dr and he ordered Ativan po for her which she agreed to take. She is quiet in her room now watching TV.

## 2019-06-27 NOTE — ED Notes (Signed)
Asleep now after receiving Ativan approx one hour ago. Eye closed, breathing regular, quiet.

## 2019-06-27 NOTE — Consult Note (Addendum)
Telepsych Consultation   Reason for Consult:  Bizarre behavior  Referring Physician:  EDP Location of Patient:  Location of Provider: [facility]  Patient Identification: Diane NoaMakia K Sockwell MRN:  [id number] Principal Diagnosis: Delusional disorder [facility](HCC) Diagnosis:  Principal Problem:   Delusional disorder (HCC) Active Problems:   Cocaine abuse with cocaine-induced psychotic disorder, with delusions (HCC)   Total Time spent with patient: 30 minutes  Subjective:   Diane Saunders is a 43 y.o. female patient admitted with bizarre behavior.   HPI:  Pt was seen and chart reviewed with treatment team and Dr Jannifer FranklinAkintayo. Pt denies suicidal/homicidal ideation, denies auditory/visual hallucinations and does not appear to be responding to internal stimuli. Pt stated she moved here from [place] a couple of months ago to be near her mother. She stated she has a diagnosis of Bipolar Disorder but does not take a mood stabilizer. She stated she takes xanax for anxiety and Trazodone for sleep. She did stated that she drank bleach mixed with water when she was in [place] because she had bugs in her legs. She stated she put ice on her legs after this and the bugs came out. She did state that she tried to get doctors to give her a sonogram to see what was going on in her legs but they would not so she took care of it herself.  Today, she reports that she thinks she has lice but, per nursing note, she does not. Pt's UDS is positive for cocaine and THC, BAL is negative. Her SARS test is negative. She did have HIV and GC?Chlamydia probes collected, results are pending.  She is calm and cooperative. Her thought process is coherent and she does endorse delusions of bugs on her person. She is receiving SSDI and is requesting help with transferring this to [place]. Social work was consulted for this and she did receive resources. She also is requesting help with housing, she is homeless. Her mother placed her under IVC due to  her delusional thinking. Pt's delusions are tactile and fixed in nature. [facility]. She will be placed on Risperdal for mood stabilization and Gabapentin for cocaine withdrawal and anxiety. She will be re-evaluated by psychiatry in the AM for potential discharge.   Past Psychiatric History: As above   Risk to Self: Suicidal Ideation: No Suicidal Intent: No Is patient at risk for suicide?: No Suicidal Plan?: No Access to Means: No What has been your use of drugs/alcohol within the last 12 months?: (occassional marijuana) How many times?: (0) Other Self Harm Risks: (none reported) Triggers for Past Attempts: (n/a) Intentional Self Injurious Behavior: None Risk to Others: Homicidal Ideation: No Thoughts of Harm to Others: No Current Homicidal Intent: No Current Homicidal Plan: No Access to Homicidal Means: No Identified Victim: (n/a) History of harm to others?: No Assessment of Violence: None Noted Violent Behavior Description: (none reported) Does patient have access to weapons?: No Criminal Charges Pending?: No Does patient have a court date: No Prior Inpatient Therapy: Prior Inpatient Therapy: No Prior Outpatient Therapy: Prior Outpatient Therapy: Yes Prior Therapy Dates: (years ago) Prior Therapy Facilty/Provider(s): ([facility] Reason for Treatment: (mental illness) Does patient have an ACCT team?: No Does patient have Intensive In-House Services?  : No Does patient have [facility] services? : No Does patient have P4CC services?: No  Past Medical History:  Past Medical History:  Diagnosis Date  . PTSD (post-traumatic stress disorder)    History  reviewed. No pertinent surgical history. Family History: History reviewed. No pertinent family history. Family Psychiatric  History: Pt did not give this information Social History:  Social History   Substance and Sexual Activity  Alcohol Use No     Social History   Substance and  Sexual Activity  Drug Use No    Social History   Socioeconomic History  . Marital status: Divorced    Spouse name: Not on file  . Number of children: Not on file  . Years of education: Not on file  . Highest education level: Not on file  Occupational History  . Not on file  Social Needs  . Financial resource strain: Not on file  . Food insecurity    Worry: Not on file    Inability: Not on file  . Transportation needs    Medical: Not on file    Non-medical: Not on file  Tobacco Use  . Smoking status: Current Every Day Smoker    Packs/day: 0.50    Types: Cigarettes  . Smokeless tobacco: Never Used  Substance and Sexual Activity  . Alcohol use: No  . Drug use: No  . Sexual activity: Not on file  Lifestyle  . Physical activity    Days per week: Not on file    Minutes per session: Not on file  . Stress: Not on file  Relationships  . Social Musicianconnections    Talks on phone: Not on file    Gets together: Not on file    Attends religious service: Not on file    Active member of club or organization: Not on file    Attends meetings of clubs or organizations: Not on file    Relationship status: Not on file  Other Topics Concern  . Not on file  Social History Narrative  . Not on file   Additional Social History:    Allergies:   Allergies  Allergen Reactions  . Mushroom Extract Complex Itching    Labs:  Results for orders placed or performed during the hospital encounter of 06/26/19 (from the past 48 hour(s))  CBC     Status: Abnormal   Collection Time: 06/26/19  7:54 PM  Result Value Ref Range   WBC 5.9 4.0 - 10.5 K/uL   RBC 4.33 3.87 - 5.11 MIL/uL   Hemoglobin 11.6 (L) 12.0 - 15.0 g/dL   HCT 16.137.7 09.636.0 - 04.546.0 %   MCV 87.1 80.0 - 100.0 fL   MCH 26.8 26.0 - 34.0 pg   MCHC 30.8 30.0 - 36.0 g/dL   RDW 40.913.5 81.111.5 - 91.415.5 %   Platelets 226 150 - 400 K/uL   nRBC 0.0 0.0 - 0.2 %    Comment: Performed at [facility], [address] [place],  [place] [address]  Comprehensive metabolic panel     Status: None   Collection Time: 06/26/19  7:54 PM  Result Value Ref Range   Sodium 140 135 - 145 mmol/L   Potassium 3.9 3.5 - 5.1 mmol/L   Chloride 107 98 - 111 mmol/L   CO2 23 22 - 32 mmol/L   Glucose, Bld 93 70 - 99 mg/dL   BUN 9 6 - 20 mg/dL   Creatinine, Ser 6.210.63 0.44 - 1.00 mg/dL   Calcium 8.9 8.9 - 30.810.3 mg/dL   Total Protein 6.9 6.5 - 8.1 g/dL   Albumin 3.9 3.5 - 5.0 g/dL   AST 38 15 - 41 U/L   ALT 44 0 -  44 U/L   Alkaline Phosphatase 57 38 - 126 U/L   Total Bilirubin 0.8 0.3 - 1.2 mg/dL   GFR calc non Af Amer >60 >60 mL/min   GFR calc Af Amer >60 >60 mL/min   Anion gap 10 5 - 15    Comment: Performed at [facility], [address] [place], [place] [address]  Ethanol     Status: None   Collection Time: 06/26/19  7:54 PM  Result Value Ref Range   Alcohol, Ethyl (B) <10 <10 mg/dL    Comment: (NOTE) Lowest detectable limit for serum alcohol is 10 mg/dL. For medical purposes only. Performed at [facility], [address] [place], [place] [address]   Acetaminophen level     Status: Abnormal   Collection Time: 06/26/19  7:54 PM  Result Value Ref Range   Acetaminophen (Tylenol), Serum <10 (L) 10 - 30 ug/mL    Comment: (NOTE) Therapeutic concentrations vary significantly. A range of 10-30 ug/mL  may be an effective concentration for many patients. However, some  are best treated at concentrations outside of this range. Acetaminophen concentrations >150 ug/mL at 4 hours after ingestion  and >50 ug/mL at 12 hours after ingestion are often associated with  toxic reactions. Performed at [facility], [address] [place], [place] [address]   Salicylate level     Status: None   Collection Time: 06/26/19  7:54 PM  Result Value Ref Range   Salicylate Lvl <7.0 2.8 - 30.0 mg/dL    Comment: Performed at [facility], [address] [place], [place]  [address]  Rapid urine drug screen (hospital performed)     Status: Abnormal   Collection Time: 06/26/19  7:55 PM  Result Value Ref Range   Opiates NONE DETECTED NONE DETECTED   Cocaine POSITIVE (A) NONE DETECTED   Benzodiazepines NONE DETECTED NONE DETECTED   Amphetamines POSITIVE (A) NONE DETECTED   Tetrahydrocannabinol POSITIVE (A) NONE DETECTED   Barbiturates NONE DETECTED NONE DETECTED    Comment: (NOTE) DRUG SCREEN FOR MEDICAL PURPOSES ONLY.  IF CONFIRMATION IS NEEDED FOR ANY PURPOSE, NOTIFY LAB WITHIN 5 DAYS. LOWEST DETECTABLE LIMITS FOR URINE DRUG SCREEN Drug Class                     Cutoff (ng/mL) Amphetamine and metabolites    1000 Barbiturate and metabolites    200 Benzodiazepine                 200 Tricyclics and metabolites     300 Opiates and metabolites        300 Cocaine and metabolites        300 THC                            50 Performed at [facility], [address] [place], [place] [address]   Pregnancy, urine     Status: None   Collection Time: 06/26/19  8:03 PM  Result Value Ref Range   Preg Test, Ur NEGATIVE NEGATIVE    Comment:        THE SENSITIVITY OF THIS METHODOLOGY IS >20 mIU/mL. Performed at [facility], [address] [place], [place] [address]     Medications:  Current Facility-Administered Medications  Medication Dose Route Frequency Provider Last Rate Last Dose  . gabapentin (NEURONTIN) capsule 300 mg  300 mg Oral  BID Stevon Gough, MD      . risperiDONE (RISPERDAL) tablet 0.5 mg  0.5 mg Oral BID Corena Pilgrim, MD       Current Outpatient Medications  Medication Sig Dispense Refill  . ALPRAZolam (XANAX) 1 MG tablet Take 1 mg by mouth at bedtime as needed for anxiety.    . traMADol (ULTRAM) 50 MG tablet Take 50 mg by mouth every 6 (six) hours as needed for severe pain.    . naproxen (NAPROSYN) 500 MG tablet Take 1 tablet (500 mg total) by mouth 2 (two) times daily. (Patient not taking:  Reported on 06/26/2019) 30 tablet 0  . orphenadrine (NORFLEX) 100 MG tablet Take 1 tablet (100 mg total) by mouth 2 (two) times daily. (Patient not taking: Reported on 06/26/2019) 30 tablet 0  . oxyCODONE-acetaminophen (PERCOCET) 5-325 MG tablet Take 1 tablet by mouth every 4 (four) hours as needed for moderate pain. (Patient not taking: Reported on 06/26/2019) 15 tablet 0  . predniSONE (DELTASONE) 50 MG tablet Take 1 tablet (50 mg total) by mouth daily. (Patient not taking: Reported on 06/26/2019) 5 tablet 0    Musculoskeletal: Strength & Muscle Tone: within normal limits Gait & Station: normal Patient leans: N/A  Psychiatric Specialty Exam: Physical Exam  Constitutional: She is oriented to person, place, and time. She appears well-developed and well-nourished.  HENT:  Head: Normocephalic.  Respiratory: Effort normal.  Musculoskeletal: Normal range of motion.  Neurological: She is alert and oriented to person, place, and time.  Psychiatric: Her speech is normal and behavior is normal. Judgment normal. Thought content is delusional. Cognition and memory are normal. She exhibits a depressed mood.    Review of Systems  Psychiatric/Behavioral: Positive for depression, hallucinations (tactile delusions) and substance abuse.  All other systems reviewed and are negative.   Blood pressure (!) 91/51, pulse (!) 57, temperature 99.9 F (37.7 C), temperature source Rectal, resp. rate 16, SpO2 100 %.There is no height or weight on file to calculate BMI.  General Appearance: Casual  Eye Contact:  Good  Speech:  Clear and Coherent and Normal Rate  Volume:  Normal  Mood:  Anxious and Depressed  Affect:  Congruent  Thought Process:  Coherent and Descriptions of Associations: Intact  Orientation:  Full (Time, Place, and Person)  Thought Content:  Delusions  Suicidal Thoughts:  No  Homicidal Thoughts:  No  Memory:  Immediate;   Fair Recent;   Fair Remote;   Fair  Judgement:  Poor  Insight:  Shallow   Psychomotor Activity:  Normal  Concentration:  Concentration: Good and Attention Span: Good  Recall:  Narcissa of Knowledge:  Good  Language:  Good  Akathisia:  Negative  Handed:  Right  AIMS (if indicated):     Assets:  Warehouse manager Resources/Insurance  ADL's:  Intact  Cognition:  WNL  Sleep:        Treatment Plan Summary: Daily contact with patient to assess and evaluate symptoms and progress in treatment, Medication management and Plan Transfer to [facility] Observation unit  Disposition: Transfer to [facility] Observation unit for safety  Risperdal 0.5 mg for mood control Gabapentin 300 mg BID for cocaine withdrawal symptoms/anxiety Reevaluate by psychiatry in the AM for potential discharge  This service was provided via telemedicine using a 2-way, interactive audio and video technology.  Names of all persons participating in this telemedicine service and their role in this encounter. Name: Yaritzel Stange Role: Patient  Name: Jinny Blossom Role: PMHNP-BC  Name:  Thedore Mins, MD Role: Psychiatrist  Name:  Role:     Laveda Abbe, NP 06/27/2019 11:36 AM Patient seen face-to-face for psychiatric evaluation, chart reviewed and case discussed with the physician extender and developed treatment plan. Reviewed the information documented and agree with the treatment plan. Thedore Mins, MD

## 2019-06-27 NOTE — ED Notes (Signed)
IVC per her mom for reportedly bizarre behaviors, see petition. She explains all moms allegations as true and discounts her moms version of the story. She received 2 mg of Ativan early this am to help her sleep and relax. She is very focused on her health, states she has bugs in her legs, lice in her hair, wanted STD tests run because she has been living in [place], states she uses condoms but wanted any tests she could have. Lab just drew her HIV test and she self swabbed earlier tonight for chlamydia and has had a COVID test but it will take up to 24 hours to result, rapid test was not used. She has remained pleasant, denies SI or HI and denies psychosis, states all of her concerns and health complaints are real. No behavior issues. She has slept since receiving the Ativan po.

## 2019-06-27 NOTE — BH Assessment (Signed)
[facility] Assessment Progress Note  Per Corena Pilgrim, MD, this pt meets criteria for IVC and is to be held overnight at this time for further observation and stabilization.  Letitia Libra, RN, [facility] has assigned pt to the [facility] Observation Unit Rm 206.  Pt presents under IVC initiated by pt's mother, and upheld by Dr Darleene Cleaver, and IVC documents have been left with Otila Kluver at [facility].  Pt's nurse, Nena Jordan, has been notified, and agrees to call report to [phone].  Pt is to be transported via Event organ[employer].   Jalene Mullet, [place] Coordinator [phone]

## 2019-06-27 NOTE — ED Notes (Signed)
Report called to [place] at [facility] unit at [facility]. She was given her HS meds prior to discharge from [facility]. She has been informed of the move and is in agreement. All property returned to her for transport. [employer] notified of need for transport. Her COVID test resulted negative. She is interested in getting housing and reconnecting with outpatient services. She denies any current thoughts to hurt self or others and denies any psychotic sx.

## 2019-06-28 ENCOUNTER — Other Ambulatory Visit: Payer: Self-pay

## 2019-06-28 ENCOUNTER — Encounter (HOSPITAL_COMMUNITY): Payer: Self-pay

## 2019-06-28 DIAGNOSIS — F319 Bipolar disorder, unspecified: Secondary | ICD-10-CM | POA: Diagnosis not present

## 2019-06-28 LAB — GC/CHLAMYDIA PROBE AMP (~~LOC~~) NOT AT ARMC
Chlamydia: NEGATIVE
Neisseria Gonorrhea: NEGATIVE

## 2019-06-28 MED ORDER — GABAPENTIN 300 MG PO CAPS: 300.0000 mg | ORAL_CAPSULE | Freq: Two times a day (BID) | ORAL | 0 refills | Status: DC

## 2019-06-28 MED ORDER — RISPERIDONE 0.5 MG PO TABS: 0.5000 mg | ORAL_TABLET | Freq: Two times a day (BID) | ORAL | 0 refills | Status: DC

## 2019-06-28 NOTE — Discharge Summary (Addendum)
Physician Discharge Summary Note  Patient:  Diane Saunders is an 43 y.o., female MRN:  [id number] DOB:  1976/04/26 Patient phone:  [phone] (home)  Patient address:   [address], [address] [place] [id number],  Total Time spent with patient: 30 minutes  Date of Admission:  06/27/2019 Date of Discharge: 06/28/2019  Reason for Admission:  Pt was seen and chart reviewed with treatment team and Dr Jannifer FranklinAkintayo. Pt denies suicidal/homicidal ideation, denies auditory/visual hallucinations and does not appear to be responding to internal stimuli. Pt is calm and cooperative and spent the night in the OBS unit without incident. Pt stated she will return to her mother's house to live until she can find a place of her own. She will also work on [place] and [place]. She is willing to follow up at [facility] for therapy and medication management. Her UDS was positive for THC, cocaine, and amphetamines, BAL negative. Per [facility], her last benzo prescription was on 04/06/2019 for Xanax 1 mg PO TID. Her delusions were most likely a result of her substance abuse. She declined any Peer Support assistance for substance abuse. Pt is psychiatrically clear.   Principal Problem: Bipolar 1 disorder [facility](HCC) Discharge Diagnoses: Principal Problem:   Bipolar 1 disorder (HCC) Active Problems:   Delusional disorder (HCC)   Cocaine abuse with cocaine-induced psychotic disorder, with delusions (HCC)   Past Psychiatric History: As above  Past Medical History:  Past Medical History:  Diagnosis Date  . Bipolar disorder (HCC)   . Depression   . Fatty liver   . Heart murmur   . PTSD (post-traumatic stress disorder)    History reviewed. No pertinent surgical history. Family History: History reviewed. No pertinent family history. Family Psychiatric  History: Pt did not give this information Social History:  Social History   Substance and Sexual Activity  Alcohol Use Yes  .  Alcohol/week: 2.0 standard drinks  . Types: 2 Cans of beer per week   Comment: Pt reports she drinks "light beer as a diuretic when her foot is swollen"     Social History   Substance and Sexual Activity  Drug Use Yes  . Types: Cocaine, Amphetamines, Marijuana    Social History   Socioeconomic History  . Marital status: Divorced    Spouse name: Not on file  . Number of children: Not on file  . Years of education: Not on file  . Highest education level: Not on file  Occupational History  . Not on file  Social Needs  . Financial resource strain: Not on file  . Food insecurity    Worry: Not on file    Inability: Not on file  . Transportation needs    Medical: Not on file    Non-medical: Not on file  Tobacco Use  . Smoking status: Current Every Day Smoker    Packs/day: 0.50    Types: Cigarettes  . Smokeless tobacco: Never Used  Substance and Sexual Activity  . Alcohol use: Yes    Alcohol/week: 2.0 standard drinks    Types: 2 Cans of beer per week    Comment: Pt reports she drinks "light beer as a diuretic when her foot is swollen"  . Drug use: Yes    Types: Cocaine, Amphetamines, Marijuana  . Sexual activity: Yes    Birth control/protection: Condom  Lifestyle  . Physical activity    Days per week: Not on file    Minutes per session: Not on  file  . Stress: Not on file  Relationships  . Social Musicianconnections    Talks on phone: Not on file    Gets together: Not on file    Attends religious service: Not on file    Active member of club or organization: Not on file    Attends meetings of clubs or organizations: Not on file    Relationship status: Not on file  Other Topics Concern  . Not on file  Social History Narrative  . Not on file    Physical Findings: AIMS: Facial and Oral Movements Muscles of Facial Expression: None, normal Lips and Perioral Area: None, normal Jaw: None, normal Tongue: None, normal,Extremity Movements Upper (arms, wrists, hands, fingers):  None, normal Lower (legs, knees, ankles, toes): None, normal, Trunk Movements Neck, shoulders, hips: None, normal, Overall Severity Severity of abnormal movements (highest score from questions above): None, normal Incapacitation due to abnormal movements: None, normal Patient's awareness of abnormal movements (rate only patient's report): No Awareness, Dental Status Current problems with teeth and/or dentures?: No Does patient usually wear dentures?: No  CIWA:  CIWA-Ar Total: 0 COWS:  COWS Total Score: 0  Musculoskeletal: Strength & Muscle Tone: within normal limits Gait & Station: normal Patient leans: N/A  Psychiatric Specialty Exam:   Blood pressure (!) 141/83, pulse 89, temperature 98.1 F (36.7 C), resp. rate 18, last menstrual period 06/24/2019.There is no height or weight on file to calculate BMI.  General Appearance: Casual  Eye Contact::  Good  Speech:  Clear and Coherent and Normal Rate409  Volume:  Normal  Mood:  Euthymic  Affect:  Congruent  Thought Process:  Coherent, Goal Directed and Descriptions of Associations: Intact  Orientation:  Full (Time, Place, and Person)  Thought Content:  Logical  Suicidal Thoughts:  No  Homicidal Thoughts:  No  Memory:  Immediate;   Good Recent;   Good Remote;   Fair  Judgement:  Fair  Insight:  Fair  Psychomotor Activity:  Normal  Concentration:  Good  Recall:  Good  Fund of Knowledge:Good  Language: Good  Akathisia:  Negative  Handed:  Right  AIMS (if indicated):     Assets:  ArchitectCommunication Skills Financial Resources/Insurance Housing Social Support  Sleep:     Cognition: WNL  ADL's:  Intact    Have you used any form of tobacco in the last 30 days? (Cigarettes, Smokeless Tobacco, Cigars, and/or Pipes): Yes  Has this patient used any form of tobacco in the last 30 days? (Cigarettes, Smokeless Tobacco, Cigars, and/or Pipes)  N/A, Pt denies tobacco use  Blood Alcohol level:  Lab Results  Component Value Date   ETH  <10 06/26/2019    Metabolic Disorder Labs:  No results found for: HGBA1C, MPG No results found for: PROLACTIN No results found for: CHOL, TRIG, HDL, CHOLHDL, VLDL, LDLCALC  See Psychiatric Specialty Exam and Suicide Risk Assessment completed by Attending Physician prior to discharge.  Discharge destination:  Home  Is patient on multiple antipsychotic therapies at discharge:  No   Has Patient had three or more failed trials of antipsychotic monotherapy by history:  No  Recommended Plan for Multiple Antipsychotic Therapies: NA   Allergies as of 06/28/2019      Reactions   Mushroom Extract Complex Itching      Medication List    STOP taking these medications   ALPRAZolam 1 MG tablet Commonly known as: XANAX   naproxen 500 MG tablet Commonly known as: NAPROSYN   orphenadrine 100 MG  tablet Commonly known as: NORFLEX   oxyCODONE-acetaminophen 5-325 MG tablet Commonly known as: Percocet   predniSONE 50 MG tablet Commonly known as: DELTASONE   traMADol 50 MG tablet Commonly known as: ULTRAM     TAKE these medications     Indication  gabapentin 300 MG capsule Commonly known as: NEURONTIN Take 1 capsule (300 mg total) by mouth 2 (two) times daily.  Indication: cocaine abuse/pain/mood   risperiDONE 0.5 MG tablet Commonly known as: RISPERDAL Take 1 tablet (0.5 mg total) by mouth 2 (two) times daily.  Indication: MIXED BIPOLAR AFFECTIVE DISORDER, delusions        Follow-up recommendations:  Activity:  as tolerated Diet:  Heart Healthy  Comments and Treatment Plan: Bipolar 1 disorder ([place])  Take all medications as prescribed by your outpatient providers. Keep all follow-up appointments as scheduled. Please contact [facility] for an appointment for medication management.  Do not consume alcohol or use illegal drugs while on prescription medications. Report any adverse effects from your medications to your primary care provider promptly.  In the event of recurrent  symptoms or worsening symptoms, call 911, a crisis hotline, or go to the nearest emergency department for evaluation.   Signed: Ethelene Hal, NP 06/28/2019, 1:13 PM  Patient seen face-to-face for psychiatric evaluation, chart reviewed and case discussed with the physician extender and developed treatment plan. Reviewed the information documented and agree with the treatment plan. Corena Pilgrim, MD

## 2019-06-28 NOTE — Progress Notes (Signed)
Patient verbalizes for discharge. Denies  SI/HI / is not psychotic or delusional . D/c instructions read to patient. All belongings returned to pt who signed for same. R- Patient verbalize understanding of discharge instructions and sign for same.Pt to follow up at [facility] Escorted to lobby

## 2019-06-28 NOTE — Progress Notes (Signed)
[facility] Crisis Plan  Reason for Crisis Plan:  Crisis Stabilization, Medication Management and Substance Abuse   Plan of Care:  Referral for IOP and Referral for Substance Abuse  Family Support:      Current Living Environment:  Living Arrangements: Parent  Insurance:   Hospital Account    Name Acct ID Class Status Primary Coverage   Charley, Lafrance [id number] [place]        Guarantor Account (for Hospital Account [id number])    Name Relation to Pt Service Area Active? Acct Type   Alfonso Ramus Self [facility] Yes [facility]   Address Phone       [address] [place], [place] [address] [phone])          Coverage Information (for Hospital Account [id number])    F/O Payor/Plan Precert #   MEDICAID OUT OF STATE[place]MEDICA[place] OF STATE [place]    Subscriber Subscriber #   Eleri, Ruben [id number]   [place]   [address] [place] [address] [phone]      Legal Guardian:     Primary Care Provider:  Larina Earthly, MD  Current Outpatient Providers:  Unsure  Psychiatrist:     Counselor/Therapist:     Compliant with Medications:  Yes  Additional Information:   Lincoln Brigham 8/7/202012:41 AM
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
## 2019-06-28 NOTE — Discharge Instructions (Signed)
For your mental health needs, you are advised to follow up with [facility].  Call them at your earliest opportunity to schedule an intake appointment:       [place]. [address]      [place], [place] [address]      [phone]      Crisis number: [phone]  For your shelter needs, contact the following service providers:       [facility])      [place], [place] [address]      [phone]       [facility]      [place]      [place], [place] [address]      [phone]  For day shelter and other supportive services for the homeless, contact the [place] [facility]):       [facility]      [place],  [address]      [phone]  For transitional housing, Secondary school teacher.  They provide longer term housing than a shelter, but there is an application process:       [facility] [employer] [facility] [place]. [address],  [address]      [phone]

## 2019-06-28 NOTE — BHH Suicide Risk Assessment (Cosign Needed)
Suicide Risk Assessment  Discharge Assessment   [facility] Discharge Suicide Risk Assessment   Principal Problem: Bipolar 1 disorder [facility]) Discharge Diagnoses: Principal Problem:   Bipolar 1 disorder ([place]) Active Problems:   Delusional disorder ([place])   Cocaine abuse with cocaine-induced psychotic disorder, with delusions ([place])   Total Time spent with patient: 30 minutes  Musculoskeletal: Strength & Muscle Tone: within normal limits Gait & Station: normal Patient leans: N/A  Psychiatric Specialty Exam:   Blood pressure (!) 141/83, pulse 89, temperature 98.1 F (36.7 C), resp. rate 18, last menstrual period 06/24/2019.There is no height or weight on file to calculate BMI.  General Appearance: Casual  Eye Contact::  Good  Speech:  Clear and Coherent and Normal Rate409  Volume:  Normal  Mood:  Euthymic  Affect:  Congruent  Thought Process:  Coherent, Goal Directed and Descriptions of Associations: Intact  Orientation:  Full (Time, Place, and Person)  Thought Content:  Logical  Suicidal Thoughts:  No  Homicidal Thoughts:  No  Memory:  Immediate;   Good Recent;   Good Remote;   Fair  Judgement:  Fair  Insight:  Fair  Psychomotor Activity:  Normal  Concentration:  Good  Recall:  Good  Fund of Knowledge:Good  Language: Good  Akathisia:  Negative  Handed:  Right  AIMS (if indicated):     Assets:  Agricultural consultant Housing Social Support  Sleep:     Cognition: WNL  ADL's:  Intact   Mental Status Per Nursing Assessment::   On Admission:  Pt was seen and chart reviewed with treatment team and Dr Darleene Cleaver. Pt denies suicidal/homicidal ideation, denies auditory/visual hallucinations and does not appear to be responding to internal stimuli. Pt is calm and cooperative and spent the night in the OBS unit without incident. Pt stated she will return to her mother's house to live until she can find a place of her own. She will also work on transferring  her [place] and insurance from [place] to [place]. She is willing to follow up at [facility] for therapy and medication management. Her UDS was positive for THC, cocaine, and amphetamines, BAL negative. Per [place], her last benzo prescription was on 04/06/2019 for Xanax 1 mg PO TID. Her delusions were most likely a result of her substance abuse. She declined any Peer Support assistance for substance abuse. Pt is psychiatrically clear.   Demographic Factors:  Low socioeconomic status and Unemployed  Loss Factors: Financial problems/change in socioeconomic status  Historical Factors: Family history of mental illness or substance abuse  Risk Reduction Factors:   Sense of responsibility to family  Continued Clinical Symptoms:  Bipolar Disorder:   Mixed State Alcohol/Substance Abuse/Dependencies  Cognitive Features That Contribute To Risk:  Closed-mindedness    Suicide Risk:  Minimal: No identifiable suicidal ideation.  Patients presenting with no risk factors but with morbid ruminations; may be classified as minimal risk based on the severity of the depressive symptoms   Plan Of Care/Follow-up recommendations:  Activity:  as tolerated Diet:  Heart Healthy  Ethelene Hal, NP 06/28/2019, 1:05 PM

## 2019-06-28 NOTE — BH Assessment (Signed)
Newborn Assessment Progress Note  Per Corena Pilgrim, MD, this pt does not require psychiatric hospitalization at this time.  Pt presents under IVC initiated by [facility] staff and upheld by Dr Darleene Cleaver, which he has now rescinded.  Pt is to be discharged from the [facility] with recommendation to follow [facility] with [facility]  She is also to be provided with information regarding area supportive services for the homeless.  These have been included in pt's discharge instructions.  Pt's nurse has been notified.  Jalene Mullet, [place] Triage Specialist [phone]

## 2019-08-06 ENCOUNTER — Other Ambulatory Visit: Payer: Self-pay

## 2019-08-06 ENCOUNTER — Encounter (HOSPITAL_COMMUNITY): Payer: Self-pay | Admitting: *Deleted

## 2019-08-06 ENCOUNTER — Emergency Department (HOSPITAL_COMMUNITY)
Admission: EM | Admit: 2019-08-06 | Discharge: 2019-08-06 | Disposition: A | Discharge status: Home / Self Care | Payer: Medicaid - Out of State | Attending: Emergency Medicine | Admitting: Emergency Medicine

## 2019-08-06 DIAGNOSIS — F1290 Cannabis use, unspecified, uncomplicated: Secondary | ICD-10-CM | POA: Diagnosis not present

## 2019-08-06 DIAGNOSIS — R441 Visual hallucinations: Secondary | ICD-10-CM | POA: Insufficient documentation

## 2019-08-06 DIAGNOSIS — F1490 Cocaine use, unspecified, uncomplicated: Secondary | ICD-10-CM | POA: Diagnosis not present

## 2019-08-06 DIAGNOSIS — F4310 Post-traumatic stress disorder, unspecified: Secondary | ICD-10-CM | POA: Insufficient documentation

## 2019-08-06 DIAGNOSIS — F319 Bipolar disorder, unspecified: Secondary | ICD-10-CM | POA: Diagnosis not present

## 2019-08-06 DIAGNOSIS — F17210 Nicotine dependence, cigarettes, uncomplicated: Secondary | ICD-10-CM | POA: Diagnosis not present

## 2019-08-06 DIAGNOSIS — H9202 Otalgia, left ear: Secondary | ICD-10-CM | POA: Insufficient documentation

## 2019-08-06 LAB — CBC WITH DIFFERENTIAL/PLATELET
Abs Immature Granulocytes: 0.02 10*3/uL (ref 0.00–0.07)
Basophils Absolute: 0 10*3/uL (ref 0.0–0.1)
Basophils Relative: 0 %
Eosinophils Absolute: 0.2 10*3/uL (ref 0.0–0.5)
Eosinophils Relative: 3 %
HCT: 35.3 % — ABNORMAL LOW (ref 36.0–46.0)
Hemoglobin: 11.3 g/dL — ABNORMAL LOW (ref 12.0–15.0)
Immature Granulocytes: 0 %
Lymphocytes Relative: 43 %
Lymphs Abs: 3.2 10*3/uL (ref 0.7–4.0)
MCH: 27.2 pg (ref 26.0–34.0)
MCHC: 32 g/dL (ref 30.0–36.0)
MCV: 84.9 fL (ref 80.0–100.0)
Monocytes Absolute: 0.6 10*3/uL (ref 0.1–1.0)
Monocytes Relative: 8 %
Neutro Abs: 3.4 10*3/uL (ref 1.7–7.7)
Neutrophils Relative %: 46 %
Platelets: 279 10*3/uL (ref 150–400)
RBC: 4.16 MIL/uL (ref 3.87–5.11)
RDW: 14.1 % (ref 11.5–15.5)
WBC: 7.4 10*3/uL (ref 4.0–10.5)
nRBC: 0 % (ref 0.0–0.2)

## 2019-08-06 LAB — RAPID URINE DRUG SCREEN, HOSP PERFORMED
Amphetamines: NOT DETECTED
Barbiturates: NOT DETECTED
Benzodiazepines: NOT DETECTED
Cocaine: NOT DETECTED
Opiates: NOT DETECTED
Tetrahydrocannabinol: NOT DETECTED

## 2019-08-06 LAB — COMPREHENSIVE METABOLIC PANEL
ALT: 21 U/L (ref 0–44)
AST: 24 U/L (ref 15–41)
Albumin: 3.9 g/dL (ref 3.5–5.0)
Alkaline Phosphatase: 58 U/L (ref 38–126)
Anion gap: 12 (ref 5–15)
BUN: 6 mg/dL (ref 6–20)
CO2: 20 mmol/L — ABNORMAL LOW (ref 22–32)
Calcium: 9 mg/dL (ref 8.9–10.3)
Chloride: 104 mmol/L (ref 98–111)
Creatinine, Ser: 0.79 mg/dL (ref 0.44–1.00)
GFR calc Af Amer: >60 mL/min (ref >60)
GFR calc non Af Amer: >60 mL/min (ref >60)
Glucose, Bld: 100 mg/dL — ABNORMAL HIGH (ref 70–99)
Potassium: 3.5 mmol/L (ref 3.5–5.1)
Sodium: 136 mmol/L (ref 135–145)
Total Bilirubin: 0.2 mg/dL — ABNORMAL LOW (ref 0.3–1.2)
Total Protein: 7.1 g/dL (ref 6.5–8.1)

## 2019-08-06 LAB — ETHANOL: Alcohol, Ethyl (B): <10 mg/dL (ref <10)

## 2019-08-06 LAB — I-STAT BETA HCG BLOOD, ED (MC, WL, AP ONLY): I-stat hCG, quantitative: <5 m[IU]/mL (ref <5)

## 2019-08-06 MED ORDER — HYDROXYZINE HCL 25 MG PO TABS: 25.0000 mg | ORAL_TABLET | Freq: Four times a day (QID) | ORAL | 0 refills | Status: DC

## 2019-08-06 MED ORDER — NAPROXEN 500 MG PO TABS: 500.0000 mg | ORAL_TABLET | Freq: Two times a day (BID) | ORAL | 0 refills | Status: DC

## 2019-08-06 NOTE — ED Notes (Signed)
Diane Householder, MD made aware of pt presentation re: denying SI &HI and being cooperative with RN concern for visual hallucinations, per MD pt is voluntary and can leave on her free will, pt agrees to wait to be sitted and verbalizes agreement in telling a RN if she decides to leave

## 2019-08-06 NOTE — ED Triage Notes (Signed)
Pt in states, "I have something that is crawling on me and coming through my jeans and clothes." pt pointing to lint on pants and clothes, pt reports this is going on for months and that they turn into big worms when she adds  Antibacterial cleanser, pt reports staying with her mom and recently was homeless, pt denies recent ETOH and drug use, cooperative at this time, A&Ox4

## 2019-08-06 NOTE — ED Notes (Signed)
Pt remains in triage area

## 2019-08-06 NOTE — ED Provider Notes (Signed)
[facility] EMERGENCY DEPARTMENT Provider Note   CSN: [id number] Arrival date & time: 08/06/19  1152     History   Chief Complaint Chief Complaint  Patient presents with  . Hallucinations    HPI Diane Saunders is a 43 y.o. female  With a past medical history of bipolar disorder, PTSD, delusional disorder, cocaine abuse who presents to ED for visual hallucinations.  States that for the past 7 months she has seen bugs crawling on her skin, clothing and personal belongings.  States that the only way she can get rid of them is by washing her close, taking several showers.  However she is concerned because anytime she goes to someone else's house or car she again is infested with these bugs.  She is asking if we can provide her with any topical treatment "maybe like a Clorox wipe" that can help her get rid of the bugs.  She states that the bugs are infesting her pores and trying to multiply. She then states "I am usually separate from everyone else, there are so many things in the air and on surfaces and I do not want to be exposed to any of them." Denies HI, SI, auditory hallucinations, drug abuse or alcohol abuse.     HPI  Past Medical History:  Diagnosis Date  . Bipolar disorder (HCC)   . Depression   . Fatty liver   . Heart murmur   . PTSD (post-traumatic stress disorder)     Patient Active Problem List   Diagnosis Date Noted  . Delusional disorder (HCC) 06/27/2019  . Cocaine abuse with cocaine-induced psychotic disorder, with delusions (HCC) 06/27/2019  . Bipolar 1 disorder (HCC) 06/27/2019    History reviewed. No pertinent surgical history.   OB History   No obstetric history on file.      Home Medications    Prior to Admission medications   Medication Sig Start Date End Date Taking? Authorizing Provider  hydrOXYzine (ATARAX/VISTARIL) 25 MG tablet Take 1 tablet (25 mg total) by mouth every 6 (six) hours. 08/06/19   Shekelia Boutin, PA-C  naproxen  (NAPROSYN) 500 MG tablet Take 1 tablet (500 mg total) by mouth 2 (two) times daily. 08/06/19   Dietrich Pates, PA-C    Family History No family history on file.  Social History Social History   Tobacco Use  . Smoking status: Current Every Day Smoker    Packs/day: 0.50    Types: Cigarettes  . Smokeless tobacco: Never Used  Substance Use Topics  . Alcohol use: Yes    Alcohol/week: 2.0 standard drinks    Types: 2 Cans of beer per week    Comment: Pt reports she drinks "light beer as a diuretic when her foot is swollen"  . Drug use: Yes    Types: Cocaine, Amphetamines, Marijuana    Comment: last use last month on 08/06/2019 assessment     Allergies   Mushroom extract complex   Review of Systems Review of Systems  Unable to perform ROS: Psychiatric disorder  Psychiatric/Behavioral: Positive for hallucinations.     Physical Exam Updated Vital Signs BP 115/75   Pulse 69   Temp 98.3 F (36.8 C)   Resp 18   SpO2 100%   Physical Exam Vitals signs and nursing note reviewed.  Constitutional:      General: She is not in acute distress.    Appearance: She is well-developed.  HENT:     Head: Normocephalic and atraumatic.  Nose: Nose normal.  Eyes:     General: No scleral icterus.       Right eye: No discharge.        Left eye: No discharge.     Conjunctiva/sclera: Conjunctivae normal.  Neck:     Musculoskeletal: Normal range of motion and neck supple.  Cardiovascular:     Rate and Rhythm: Normal rate and regular rhythm.     Heart sounds: Normal heart sounds. No murmur. No friction rub. No gallop.   Pulmonary:     Effort: Pulmonary effort is normal. No respiratory distress.     Breath sounds: Normal breath sounds.  Abdominal:     General: Bowel sounds are normal. There is no distension.     Palpations: Abdomen is soft.     Tenderness: There is no abdominal tenderness. There is no guarding.  Musculoskeletal: Normal range of motion.  Skin:    General: Skin is warm  and dry.     Findings: No rash.  Neurological:     Mental Status: She is alert.     Motor: No abnormal muscle tone.     Coordination: Coordination normal.  Psychiatric:        Attention and Perception: She perceives visual hallucinations.        Thought Content: Thought content is paranoid.      ED Treatments / Results  Labs (all labs ordered are listed, but only abnormal results are displayed) Labs Reviewed  COMPREHENSIVE METABOLIC PANEL - Abnormal; Notable for the following components:      Result Value   CO2 20 (*)    Glucose, Bld 100 (*)    Total Bilirubin 0.2 (*)    All other components within normal limits  CBC WITH DIFFERENTIAL/PLATELET - Abnormal; Notable for the following components:   Hemoglobin 11.3 (*)    HCT 35.3 (*)    All other components within normal limits  ETHANOL  RAPID URINE DRUG SCREEN, HOSP PERFORMED  I-STAT BETA HCG BLOOD, ED ([facility], [facility], [facility] ONLY)    EKG None  Radiology No results found.  Procedures Procedures (including critical care time)  Medications Ordered in ED Medications - No data to display   Initial Impression / Assessment and Plan / ED Course  I have reviewed the triage vital signs and the nursing notes.  Pertinent labs & imaging results that were available during my care of the patient were reviewed by me and considered in my medical decision making (see chart for details).        43 year old female presents to ED for evaluation of visual hallucinations.  This is been going on for the past 7 months.  Feels that she has bugs crawling on her skin.  [facility].  She denies any SI, HI or auditory hallucinations.  Medical screening lab work is unremarkable.  Patient states that she does not want to stay for behavioral health consult.  States that "[facility], I do not need a psych consult every time I am here I am here because my ear hurts."  Has some swelling in her cartilage area in her left ear  with no abnormalities of the EOMs, tenderness palpation.  Will give naproxen for swelling, Atarax as needed for itching.  Patient is not a threat to herself or others so feel is reasonable to discharge her home with [facility].  Patient is hemodynamically stable, in NAD, and able to ambulate in the ED. Evaluation does not  show pathology that would require ongoing emergent intervention or inpatient treatment. I explained the diagnosis to the patient. Pain has been managed and has no complaints prior to discharge. Patient is comfortable with above plan and is stable for discharge at this time. All questions were answered prior to disposition. Strict return precautions for returning to the ED were discussed. Encouraged follow up with PCP.   An After Visit Summary was printed and given to the patient.   Portions of this note were generated with Lobbyist. Dictation errors may occur despite best attempts at proofreading.  Final Clinical Impressions(s) / ED Diagnoses   Final diagnoses:  Visual hallucinations    ED Discharge Orders         Ordered    naproxen (NAPROSYN) 500 MG tablet  2 times daily     08/06/19 2305    hydrOXYzine (ATARAX/VISTARIL) 25 MG tablet  Every 6 hours     08/06/19 2305           Delia Heady, PA-C 08/06/19 2308    Quintella Reichert, MD 08/07/19 1128

## 2019-08-06 NOTE — ED Notes (Signed)
Pt verbalized understanding of discharge instructions and scripts.  

## 2019-09-25 ENCOUNTER — Emergency Department (HOSPITAL_COMMUNITY): Payer: Medicaid Other

## 2019-09-25 ENCOUNTER — Emergency Department (HOSPITAL_COMMUNITY)
Admission: EM | Admit: 2019-09-25 | Discharge: 2019-09-25 | Disposition: A | Discharge status: Home / Self Care | Payer: Medicaid Other | Attending: Emergency Medicine | Admitting: Emergency Medicine

## 2019-09-25 ENCOUNTER — Other Ambulatory Visit: Payer: Self-pay

## 2019-09-25 DIAGNOSIS — M25561 Pain in right knee: Secondary | ICD-10-CM | POA: Insufficient documentation

## 2019-09-25 DIAGNOSIS — R519 Headache, unspecified: Secondary | ICD-10-CM | POA: Insufficient documentation

## 2019-09-25 DIAGNOSIS — R42 Dizziness and giddiness: Secondary | ICD-10-CM | POA: Diagnosis present

## 2019-09-25 DIAGNOSIS — Z79899 Other long term (current) drug therapy: Secondary | ICD-10-CM | POA: Diagnosis not present

## 2019-09-25 DIAGNOSIS — F17210 Nicotine dependence, cigarettes, uncomplicated: Secondary | ICD-10-CM | POA: Diagnosis not present

## 2019-09-25 LAB — URINALYSIS, ROUTINE W REFLEX MICROSCOPIC
Bilirubin Urine: NEGATIVE
Glucose, UA: NEGATIVE mg/dL
Hgb urine dipstick: NEGATIVE
Ketones, ur: NEGATIVE mg/dL
Nitrite: NEGATIVE
Protein, ur: NEGATIVE mg/dL
Specific Gravity, Urine: 1.025 (ref 1.005–1.030)
pH: 5 (ref 5.0–8.0)

## 2019-09-25 LAB — CBC WITH DIFFERENTIAL/PLATELET
Abs Immature Granulocytes: 0.02 10*3/uL (ref 0.00–0.07)
Basophils Absolute: 0 10*3/uL (ref 0.0–0.1)
Basophils Relative: 0 %
Eosinophils Absolute: 0.2 10*3/uL (ref 0.0–0.5)
Eosinophils Relative: 2 %
HCT: 36.6 % (ref 36.0–46.0)
Hemoglobin: 11.6 g/dL — ABNORMAL LOW (ref 12.0–15.0)
Immature Granulocytes: 0 %
Lymphocytes Relative: 48 %
Lymphs Abs: 3.5 10*3/uL (ref 0.7–4.0)
MCH: 26.9 pg (ref 26.0–34.0)
MCHC: 31.7 g/dL (ref 30.0–36.0)
MCV: 84.9 fL (ref 80.0–100.0)
Monocytes Absolute: 0.4 10*3/uL (ref 0.1–1.0)
Monocytes Relative: 6 %
Neutro Abs: 3.3 10*3/uL (ref 1.7–7.7)
Neutrophils Relative %: 44 %
Platelets: 323 10*3/uL (ref 150–400)
RBC: 4.31 MIL/uL (ref 3.87–5.11)
RDW: 14.2 % (ref 11.5–15.5)
WBC: 7.4 10*3/uL (ref 4.0–10.5)
nRBC: 0 % (ref 0.0–0.2)

## 2019-09-25 LAB — COMPREHENSIVE METABOLIC PANEL
ALT: 15 U/L (ref 0–44)
AST: 18 U/L (ref 15–41)
Albumin: 3.7 g/dL (ref 3.5–5.0)
Alkaline Phosphatase: 49 U/L (ref 38–126)
Anion gap: 9 (ref 5–15)
BUN: 10 mg/dL (ref 6–20)
CO2: 23 mmol/L (ref 22–32)
Calcium: 9.2 mg/dL (ref 8.9–10.3)
Chloride: 105 mmol/L (ref 98–111)
Creatinine, Ser: 0.79 mg/dL (ref 0.44–1.00)
GFR calc Af Amer: >60 mL/min (ref >60)
GFR calc non Af Amer: >60 mL/min (ref >60)
Glucose, Bld: 113 mg/dL — ABNORMAL HIGH (ref 70–99)
Potassium: 3.6 mmol/L (ref 3.5–5.1)
Sodium: 137 mmol/L (ref 135–145)
Total Bilirubin: 0.4 mg/dL (ref 0.3–1.2)
Total Protein: 6.9 g/dL (ref 6.5–8.1)

## 2019-09-25 LAB — I-STAT BETA HCG BLOOD, ED (MC, WL, AP ONLY): I-stat hCG, quantitative: <5 m[IU]/mL (ref <5)

## 2019-09-25 LAB — HIV ANTIBODY (ROUTINE TESTING W REFLEX): HIV Screen 4th Generation wRfx: NONREACTIVE

## 2019-09-25 MED ORDER — ACETAMINOPHEN 325 MG PO TABS
650.0000 mg | ORAL_TABLET | Freq: Once | ORAL | Status: AC
  Administered 2019-09-25: 10:00:00 650 mg via ORAL
  Filled 2019-09-25: qty 2

## 2019-09-25 NOTE — ED Triage Notes (Signed)
Per pt she had a biopsy today on the external part of her left ear. Pt went home and was fine. Pt then went out for walk this evening and began to have numbness and tingling in her left ear into her neck down into her spine No blurred vision, no strength differences, says she just feels off balance on that side.

## 2019-09-25 NOTE — ED Notes (Signed)
Patient called RN into room requesting results on blood work, pain medicine and food. RN informed patient that she will have to wait for ED provider to go over results and cannot have anything by mouth until seen. Patient raised voice and RN "Can you ask if I can have a fucking graham cracker." RN informed patient that [facility] has a no tolerance for that language. RN informed PA of patient requests.

## 2019-09-25 NOTE — ED Provider Notes (Signed)
[facility]   CSN: [id number] Arrival date & time: 09/25/19  0103     History   Chief Complaint Chief Complaint  Patient presents with  . Numbness  . Otalgia    left ear    HPI Christella NoaMakia K Facer is a 43 y.o. female.     HPI   [id number] YOF presents today with several complaints. She notes that yesterday she had a biopsy performed on her left postauricular region.  She notes that several hours later she developed an episode of of weakness and dizziness.  She also notes pain in her right knee that has been ongoing and worsening.  She notes the dizziness has improved at the time my evaluation.  She denies any other associated neurological deficits.  Patient notes that when she was feeling dizzy she noted this was worse with movements and standing, she denied any associated chest pain or shortness of breath.  No significant headache at that time but notes a minor right-sided headache presently.  Past Medical History:  Diagnosis Date  . Bipolar disorder (HCC)   . Depression   . Fatty liver   . Heart murmur   . PTSD (post-traumatic stress disorder)     Patient Active Problem List   Diagnosis Date Noted  . Delusional disorder (HCC) 06/27/2019  . Cocaine abuse with cocaine-induced psychotic disorder, with delusions (HCC) 06/27/2019  . Bipolar 1 disorder (HCC) 06/27/2019    No past surgical history on file.   OB History   No obstetric history on file.      Home Medications    Prior to Admission medications   Medication Sig Start Date End Date Taking? Authorizing Provider  hydrOXYzine (ATARAX/VISTARIL) 25 MG tablet Take 1 tablet (25 mg total) by mouth every 6 (six) hours. 08/06/19   Khatri, Hina, PA-C  naproxen (NAPROSYN) 500 MG tablet Take 1 tablet (500 mg total) by mouth 2 (two) times daily. 08/06/19   Dietrich PatesKhatri, Hina, PA-C    Family History No family history on file.  Social History Social History   Tobacco Use  . Smoking  status: Current Every Day Smoker    Packs/day: 0.50    Types: Cigarettes  . Smokeless tobacco: Never Used  Substance Use Topics  . Alcohol use: Yes    Alcohol/week: 2.0 standard drinks    Types: 2 Cans of beer per week    Comment: Pt reports she drinks "light beer as a diuretic when her foot is swollen"  . Drug use: Yes    Types: Cocaine, Amphetamines, Marijuana    Comment: last use last month on 08/06/2019 assessment     Allergies   Mushroom extract complex   Review of Systems Review of Systems  All other systems reviewed and are negative.    Physical Exam Updated Vital Signs BP 118/60   Pulse (!) 55   Temp 98 F (36.7 C) (Oral)   Resp 16   SpO2 100%   Physical Exam Vitals signs and nursing note reviewed.  Constitutional:      Appearance: She is well-developed.  HENT:     Head: Normocephalic and atraumatic.  Eyes:     General: No scleral icterus.       Right eye: No discharge.        Left eye: No discharge.     Conjunctiva/sclera: Conjunctivae normal.     Pupils: Pupils are equal, round, and reactive to light.  Neck:     Musculoskeletal: Normal  range of motion.     Vascular: No JVD.     Trachea: No tracheal deviation.  Cardiovascular:     Rate and Rhythm: Normal rate and regular rhythm.  Pulmonary:     Effort: Pulmonary effort is normal.     Breath sounds: No stridor.  Neurological:     General: No focal deficit present.     Mental Status: She is alert and oriented to person, place, and time. Mental status is at baseline.     Cranial Nerves: No cranial nerve deficit.     Sensory: No sensory deficit.     Motor: No weakness.     Coordination: Coordination normal.     Gait: Gait normal.  Psychiatric:        Behavior: Behavior normal.        Thought Content: Thought content normal.        Judgment: Judgment normal.     ED Treatments / Results  Labs (all labs ordered are listed, but only abnormal results are displayed) Labs Reviewed  COMPREHENSIVE  METABOLIC PANEL - Abnormal; Notable for the following components:      Result Value   Glucose, Bld 113 (*)    All other components within normal limits  CBC WITH DIFFERENTIAL/PLATELET - Abnormal; Notable for the following components:   Hemoglobin 11.6 (*)    All other components within normal limits  URINALYSIS, ROUTINE W REFLEX MICROSCOPIC - Abnormal; Notable for the following components:   APPearance HAZY (*)    Leukocytes,Ua MODERATE (*)    Bacteria, UA RARE (*)    All other components within normal limits  HIV ANTIBODY (ROUTINE TESTING W REFLEX)  RPR  I-STAT BETA HCG BLOOD, ED ([facility], [facility], [facility] ONLY)    EKG None  Radiology Ct Head Wo Contrast  Result Date: 09/25/2019 CLINICAL DATA:  Acute frontal headache. EXAM: CT HEAD WITHOUT CONTRAST TECHNIQUE: Contiguous axial images were obtained from the base of the skull through the vertex without intravenous contrast. COMPARISON:  CT scan dated May 17, 2007 FINDINGS: Brain: No evidence of acute infarction, hemorrhage, hydrocephalus, extra-axial collection or mass lesion/mass effect. Vascular: No hyperdense vessel or unexpected calcification. Skull: Normal. Negative for fracture or focal lesion. Sinuses/Orbits: Normal Other: None IMPRESSION: Normal exam. Electronically Signed   By: Lorriane Shire M.D.   On: 09/25/2019 12:43    Procedures Procedures (including critical care time)  Medications Ordered in ED Medications  acetaminophen (TYLENOL) tablet 650 mg (650 mg Oral Given 09/25/19 1020)     Initial Impression / Assessment and Plan / ED Course  I have reviewed the triage vital signs and the nursing notes.  Pertinent labs & imaging results that were available during my care of the patient were reviewed by me and considered in my medical decision making (see chart for details).        43 year old female presents today with an episode of dizziness.  This appeared to be positional when she was having it she is no longer having dizziness.   I personally ambulated her here.  I have very low suspicion for any acute intracranial abnormality, no cardiac abnormalities.  Patient will follow-up as an outpatient with primary care return immediately if she develops any new or worsening signs or symptoms.  She verbalized understanding and agreement to today's plan had no further questions or concerns at time of discharge.  Final Clinical Impressions(s) / ED Diagnoses   Final diagnoses:  Vertigo  Acute pain of right knee    ED  Discharge Orders    None       Rosalio Loud 09/25/19 1927    Eber Hong, MD 09/26/19 1210

## 2019-09-25 NOTE — Discharge Instructions (Addendum)
Please read attached information. If you experience any new or worsening signs or symptoms please return to the emergency room for evaluation. Please follow-up with your primary care provider or specialist as discussed. Please use medication prescribed only as directed and discontinue taking if you have any concerning signs or symptoms.   °

## 2019-09-26 LAB — RPR: RPR Ser Ql: NONREACTIVE

## 2019-10-11 ENCOUNTER — Encounter (HOSPITAL_COMMUNITY): Payer: Self-pay | Admitting: Emergency Medicine

## 2019-10-11 ENCOUNTER — Emergency Department (HOSPITAL_BASED_OUTPATIENT_CLINIC_OR_DEPARTMENT_OTHER): Payer: Medicaid Other

## 2019-10-11 ENCOUNTER — Other Ambulatory Visit: Payer: Self-pay

## 2019-10-11 ENCOUNTER — Emergency Department (HOSPITAL_COMMUNITY)
Admission: EM | Admit: 2019-10-11 | Discharge: 2019-10-11 | Disposition: A | Discharge status: Home / Self Care | Payer: Medicaid Other | Attending: Emergency Medicine | Admitting: Emergency Medicine

## 2019-10-11 ENCOUNTER — Emergency Department (HOSPITAL_COMMUNITY): Payer: Medicaid Other

## 2019-10-11 DIAGNOSIS — M545 Low back pain: Secondary | ICD-10-CM | POA: Diagnosis not present

## 2019-10-11 DIAGNOSIS — S01312D Laceration without foreign body of left ear, subsequent encounter: Secondary | ICD-10-CM | POA: Diagnosis not present

## 2019-10-11 DIAGNOSIS — R609 Edema, unspecified: Secondary | ICD-10-CM | POA: Diagnosis not present

## 2019-10-11 DIAGNOSIS — Z791 Long term (current) use of non-steroidal anti-inflammatories (NSAID): Secondary | ICD-10-CM | POA: Diagnosis not present

## 2019-10-11 DIAGNOSIS — F17210 Nicotine dependence, cigarettes, uncomplicated: Secondary | ICD-10-CM | POA: Diagnosis not present

## 2019-10-11 DIAGNOSIS — M79605 Pain in left leg: Secondary | ICD-10-CM | POA: Insufficient documentation

## 2019-10-11 DIAGNOSIS — X58XXXD Exposure to other specified factors, subsequent encounter: Secondary | ICD-10-CM | POA: Diagnosis not present

## 2019-10-11 DIAGNOSIS — R0789 Other chest pain: Secondary | ICD-10-CM | POA: Insufficient documentation

## 2019-10-11 DIAGNOSIS — Z20822 Contact with and (suspected) exposure to covid-19: Secondary | ICD-10-CM

## 2019-10-11 DIAGNOSIS — R2242 Localized swelling, mass and lump, left lower limb: Secondary | ICD-10-CM | POA: Diagnosis not present

## 2019-10-11 DIAGNOSIS — R21 Rash and other nonspecific skin eruption: Secondary | ICD-10-CM

## 2019-10-11 MED ORDER — HYDROXYZINE HCL 10 MG PO TABS
10.0000 mg | ORAL_TABLET | Freq: Once | ORAL | Status: AC
  Administered 2019-10-11: 19:00:00 10 mg via ORAL
  Filled 2019-10-11: qty 1

## 2019-10-11 MED ORDER — PERMETHRIN 5 % EX CREA: TOPICAL_CREAM | CUTANEOUS | 0 refills | Status: DC

## 2019-10-11 MED ORDER — CEPHALEXIN 500 MG PO CAPS: 500.0000 mg | ORAL_CAPSULE | Freq: Two times a day (BID) | ORAL | 0 refills | Status: AC

## 2019-10-11 MED ORDER — ACETAMINOPHEN 325 MG PO TABS
650.0000 mg | ORAL_TABLET | Freq: Once | ORAL | Status: AC
  Administered 2019-10-11: 650 mg via ORAL
  Filled 2019-10-11: qty 2

## 2019-10-11 NOTE — ED Triage Notes (Signed)
Pt reports right foot pain and back pain for about a week.

## 2019-10-11 NOTE — ED Notes (Signed)
Pt states that she doesn't want anything done until she gets pain meds. Refused EKG at this time

## 2019-10-11 NOTE — Progress Notes (Signed)
Lower extremity venous has been completed.   Preliminary results in CV Proc.   Abram Sander 10/11/2019 7:07 PM

## 2019-10-11 NOTE — ED Provider Notes (Signed)
[facility]   CSN: [id number] Arrival date & time: 10/11/19  1727     History   Chief Complaint Chief Complaint  Patient presents with   Leg Pain   Back Pain    HPI [facility] is a 43 y.o. female.     HPI  [facility]iane [facility] is 43 year old female with PMH of bipolar disorder, depression, PTSD who presents to the ED with concern for back and foot pain. Patient reports over the last week she has noticed pain and swelling of her left leg.  Patient states mostly her ankle hurts.  She has not fallen or had trauma to her leg that she knows of.  She has not tried anything for pain at home.  Patient also reports 5 days ago she had a burning sensation in her chest that lasted 3 hours.  She states her doctor told her to drink a beer and help get the gas out.  Pain has been resolved.  No current chest pain.  She denies any difficulty breathing.  No prior history of known blood clots.  No recent illness.  For the remainder of the interview, patient perseverates on having bugs crawling on her and jumping out of her skin.  She points to various body parts stating "see this".  There is some evidence of cotton lint on patient's leg from her sweatpants.  Patient denies abdominal pain.  No nausea, vomiting, diarrhea.  Patient's currently on her menstrual period.  She denies any dysuria.  No prior history of kidney stones.  No numbness, tingling, weakness.  No saddle anesthesia.  Past Medical History:  Diagnosis Date   Bipolar disorder (HCC)    Depression    Fatty liver    Heart murmur    PTSD (post-traumatic stress disorder)     Patient Active Problem List   Diagnosis Date Noted   Delusional disorder (HCC) 06/27/2019   Cocaine abuse with cocaine-induced psychotic disorder, with delusions (HCC) 06/27/2019   Bipolar 1 disorder (HCC) 06/27/2019    History reviewed. No pertinent surgical history.   OB History   No obstetric history on  file.      Home Medications    Prior to Admission medications   Medication Sig Start Date End Date Taking? Authorizing Provider  cephALEXin (KEFLEX) 500 MG capsule Take 1 capsule (500 mg total) by mouth 2 (two) times daily for 7 days. 10/11/19 10/18/19  Curatolo, Adam, DO  hydrOXYzine (ATARAX/VISTARIL) 25 MG tablet Take 1 tablet (25 mg total) by mouth every 6 (six) hours. 08/06/19   Khatri, Hina, PA-C  naproxen (NAPROSYN) 500 MG tablet Take 1 tablet (500 mg total) by mouth 2 (two) times daily. 08/06/19   Khatri, Hillary BowHina, PA-C  permethrin (ELIMITE) 5 % cream Apply to affected area once not including face 10/11/19   Virgina Norfolkuratolo, Adam, DO    Family History No family history on file.  Social History Social History   Tobacco Use   Smoking status: Current Every Day Smoker    Packs/day: 0.50    Types: Cigarettes   Smokeless tobacco: Never Used  Substance Use Topics   Alcohol use: Yes    Alcohol/week: 2.0 standard drinks    Types: 2 Cans of beer per week    Comment: Pt reports she drinks "light beer as a diuretic when her foot is swollen"   Drug use: Yes    Types: Cocaine, Amphetamines, Marijuana    Comment: last use last month  on 08/06/2019 assessment     Allergies   Mushroom extract complex   Review of Systems Review of Systems  Constitutional: Negative for chills and fever.  HENT: Negative for ear pain and sore throat.   Eyes: Negative for pain and visual disturbance.  Respiratory: Negative for cough and shortness of breath.   Cardiovascular: Positive for leg swelling. Negative for chest pain and palpitations.  Gastrointestinal: Negative for abdominal pain and vomiting.  Genitourinary: Negative for dysuria and hematuria.  Musculoskeletal: Positive for back pain and myalgias. Negative for arthralgias, gait problem and joint swelling.  Skin: Positive for rash and wound. Negative for color change.  Neurological: Negative for seizures and syncope.  Psychiatric/Behavioral:  Negative for self-injury, sleep disturbance and suicidal ideas.       Stable delusions   All other systems reviewed and are negative.    Physical Exam Updated Vital Signs BP 109/75    Pulse 83    Temp 98.8 F (37.1 C) (Oral)    LMP 10/11/2019    SpO2 96%   Physical Exam Vitals signs and nursing note reviewed.  Constitutional:      General: She is not in acute distress.    Appearance: She is well-developed.  HENT:     Head: Normocephalic and atraumatic.  Eyes:     Extraocular Movements: Extraocular movements intact.     Conjunctiva/sclera: Conjunctivae normal.  Neck:     Musculoskeletal: Neck supple.  Cardiovascular:     Rate and Rhythm: Normal rate and regular rhythm.     Heart sounds: No murmur.  Pulmonary:     Effort: Pulmonary effort is normal. No respiratory distress.     Breath sounds: Normal breath sounds.  Abdominal:     Palpations: Abdomen is soft.     Tenderness: There is no abdominal tenderness.  Musculoskeletal:        General: Swelling present. No tenderness or signs of injury.     Comments: Mild swelling of L lower leg/ankle No erythema or warmth FROM of ankle  Sensation intact 2+ DP pulses    Skin:    General: Skin is warm and dry.     Findings: Rash (Multiple healed scar lesions of bilateral thighs L lateral thigh with small open lesion, no fluctuance or erythema) present.  Neurological:     General: No focal deficit present.     Mental Status: She is alert and oriented to person, place, and time.     Sensory: No sensory deficit.     Motor: No weakness.     Gait: Gait normal.      ED Treatments / Results  Labs (all labs ordered are listed, but only abnormal results are displayed) Labs Reviewed - No data to display  EKG None  Radiology Dg Ankle 2 Views Left  Result Date: 10/11/2019 CLINICAL DATA:  Pain and swelling EXAM: LEFT ANKLE - 2 VIEW COMPARISON:  None. FINDINGS: No fracture or malalignment. Ankle mortise is symmetric. Soft tissue  swelling is present IMPRESSION: No acute osseous abnormality Electronically Signed   By: Jasmine Pang M.D.   On: 10/11/2019 19:13   Vas [place] Lower Extremity Venous (dvt) (only [facility])  Result Date: 10/11/2019  Lower Venous Study Indications: Edema.  Comparison Study: no prior Performing Technologist: Blanch Media RVS  Examination Guidelines: A complete evaluation includes B-mode imaging, spectral Doppler, color Doppler, and power Doppler as needed of all accessible portions of each vessel. Bilateral testing is considered an integral part of a complete  examination. Limited examinations for reoccurring indications may be performed as noted.  +-----+---------------+---------+-----------+----------+--------------+  RIGHT Compressibility Phasicity Spontaneity Properties Thrombus Aging  +-----+---------------+---------+-----------+----------+--------------+  CFV   Full            Yes       Yes                                    +-----+---------------+---------+-----------+----------+--------------+   +---------+---------------+---------+-----------+----------+--------------+  LEFT      Compressibility Phasicity Spontaneity Properties Thrombus Aging  +---------+---------------+---------+-----------+----------+--------------+  CFV       Full            Yes       Yes                                    +---------+---------------+---------+-----------+----------+--------------+  SFJ       Full                                                             +---------+---------------+---------+-----------+----------+--------------+  FV Prox   Full                                                             +---------+---------------+---------+-----------+----------+--------------+  FV Mid    Full                                                             +---------+---------------+---------+-----------+----------+--------------+  FV Distal Full                                                              +---------+---------------+---------+-----------+----------+--------------+  PFV       Full                                                             +---------+---------------+---------+-----------+----------+--------------+  POP       Full            Yes       Yes                                    +---------+---------------+---------+-----------+----------+--------------+  PTV       Full                                                             +---------+---------------+---------+-----------+----------+--------------+  PERO      Full                                                             +---------+---------------+---------+-----------+----------+--------------+     Summary: Right: No evidence of common femoral vein obstruction. Left: There is no evidence of deep vein thrombosis in the lower extremity. No cystic structure found in the popliteal fossa.  *See table(s) above for measurements and observations.    Preliminary     Procedures .Suture Removal  Date/Time: 10/12/2019 1:31 PM Performed by: Norton Pastel, MD Authorized by: Virgina Norfolk, DO   Consent:    Consent obtained:  Verbal   Consent given by:  Patient   Risks discussed:  Bleeding, pain and wound separation Location:    Location:  Head/neck   Head/neck location:  Ear   Ear location:  L ear Procedure details:    Wound appearance:  No signs of infection   Number of sutures removed:  5 Post-procedure details:    Post-removal:  No dressing applied   Patient tolerance of procedure:  Tolerated well, no immediate complications   (including critical care time)  Medications Ordered in ED Medications  acetaminophen (TYLENOL) tablet 650 mg (650 mg Oral Given 10/11/19 1834)  hydrOXYzine (ATARAX/VISTARIL) tablet 10 mg (10 mg Oral Given 10/11/19 1834)     Initial Impression / Assessment and Plan / ED Course  I have reviewed the triage vital signs and the nursing notes.  Pertinent labs & imaging results that were  available during my care of the patient were reviewed by me and considered in my medical decision making (see chart for details).        On arrival, patient is afebrile, he medically stable, well-appearing  Patient has multiple complaints.  Patient appears to have stable delusions of bugs crawling on her.  Per chart review, this has been present in the past.  Patient is not suicidal or homicidal.  She does not appear to be a threat to herself or others.  Patient does have multiple areas of skin excoriation on her legs and healed scars likely from picking.  There is 1 area on her left lateral thigh that is open.  Will prescribe Keflex.  There is no surrounding erythema or fluctuance to suggest cellulitis or abscess. Given all over itching and excoriation, will give permethrin cream for possible scabies.   Regarding patient's left leg pain that is atraumatic and mildly swollen; X-ray no acute fracture DVT study without acute findings Patient has full range of motion of her ankle, not consistent with septic arthritis.  Patient is ambulatory without difficulty.  Overall, concern for likely mild ankle sprain.  Patient given Tylenol for pain control. Patient also endorses left hip/lower left back pain.  No urinary symptoms.  No abdominal pain.  No nausea/vomiting.  Low suspicion for nephrolithiasis.  Patient has no red flag symptoms of numbness, tingling, weakness, saddle anesthesia to suggest cauda equina.  Overall, concern for MSK left lower extremity pain.  Upon assessment, patient endorses that she had sutures placed on her left ear 2 weeks ago and supposed to have been removed a week ago but never went.  Sutures appear clean dry and intact.  Sutures were removed as documented above without complication.  Overall, low suspicion for acute  emergent process.  Patient stable for discharge with symptomatic management at home and PCP follow-up.  Patient also encouraged to follow-up with [facility] given her  underlying psychiatric illnesses and stable delusions.  Return precautions given.   Final Clinical Impressions(s) / ED Diagnoses   Final diagnoses:  Rash  Pain of left lower extremity    ED Discharge Orders         Ordered    permethrin (ELIMITE) 5 % cream  Status:  Discontinued     10/11/19 1925    cephALEXin (KEFLEX) 500 MG capsule  2 times daily     10/11/19 1925    permethrin (ELIMITE) 5 % cream     10/11/19 1927           Norton Pastel, MD 10/12/19 1503    Virgina Norfolk, DO 10/13/19 [phone]

## 2019-10-11 NOTE — ED Provider Notes (Signed)
I have personally seen and examined the patient. I have reviewed the documentation on PMH/FH/Soc Hx. I have discussed the plan of care with the resident and patient.  I have reviewed and agree with the resident's documentation. Please see associated encounter note.  Briefly, the patient is a 43 y.o. female here with multiple concerns.  With me patient is mostly concerned about skin issues.  She has already had a DVT study and x-ray of her ankle.  Left ankle shows no fracture.  DVT study is negative.  She is mostly concerned for possible cellulitis on the back of her left leg.  She appears to have some folliculitis.  Will treat with Keflex.  Possibly has scabies and will give permethrin cream.  Overall given reassurance and discharged in the ED in good condition.  This chart was dictated using voice recognition software.  Despite best efforts to proofread,  errors can occur which can change the documentation meaning.     EKG Interpretation None         Lennice Sites, DO 10/11/19 1926

## 2019-10-11 NOTE — ED Notes (Signed)
Patient verbalizes understanding of discharge instructions. Opportunity for questioning and answers were provided. Armband removed by staff, pt discharged from ED ambulatory.   

## 2019-10-14 LAB — NOVEL CORONAVIRUS, NAA: SARS-CoV-2, NAA: NOT DETECTED

## 2019-10-21 ENCOUNTER — Other Ambulatory Visit: Payer: Self-pay

## 2019-10-21 ENCOUNTER — Emergency Department (HOSPITAL_COMMUNITY)
Admission: EM | Admit: 2019-10-21 | Discharge: 2019-10-21 | Disposition: A | Discharge status: Home / Self Care | Payer: Medicaid Other | Attending: Emergency Medicine | Admitting: Emergency Medicine

## 2019-10-21 ENCOUNTER — Encounter (HOSPITAL_COMMUNITY): Payer: Self-pay | Admitting: Pharmacy Technician

## 2019-10-21 ENCOUNTER — Emergency Department (HOSPITAL_COMMUNITY): Payer: Medicaid Other

## 2019-10-21 DIAGNOSIS — F319 Bipolar disorder, unspecified: Secondary | ICD-10-CM | POA: Insufficient documentation

## 2019-10-21 DIAGNOSIS — F17210 Nicotine dependence, cigarettes, uncomplicated: Secondary | ICD-10-CM | POA: Insufficient documentation

## 2019-10-21 DIAGNOSIS — T07XXXA Unspecified multiple injuries, initial encounter: Secondary | ICD-10-CM

## 2019-10-21 DIAGNOSIS — Z791 Long term (current) use of non-steroidal anti-inflammatories (NSAID): Secondary | ICD-10-CM | POA: Diagnosis not present

## 2019-10-21 DIAGNOSIS — Z79899 Other long term (current) drug therapy: Secondary | ICD-10-CM | POA: Diagnosis not present

## 2019-10-21 DIAGNOSIS — R072 Precordial pain: Secondary | ICD-10-CM | POA: Diagnosis not present

## 2019-10-21 DIAGNOSIS — R0789 Other chest pain: Secondary | ICD-10-CM | POA: Diagnosis present

## 2019-10-21 LAB — CBC
HCT: 38.3 % (ref 36.0–46.0)
Hemoglobin: 12.1 g/dL (ref 12.0–15.0)
MCH: 26.6 pg (ref 26.0–34.0)
MCHC: 31.6 g/dL (ref 30.0–36.0)
MCV: 84.2 fL (ref 80.0–100.0)
Platelets: 305 10*3/uL (ref 150–400)
RBC: 4.55 MIL/uL (ref 3.87–5.11)
RDW: 13.7 % (ref 11.5–15.5)
WBC: 10 10*3/uL (ref 4.0–10.5)
nRBC: 0 % (ref 0.0–0.2)

## 2019-10-21 LAB — COMPREHENSIVE METABOLIC PANEL
ALT: 16 U/L (ref 0–44)
AST: 18 U/L (ref 15–41)
Albumin: 3.9 g/dL (ref 3.5–5.0)
Alkaline Phosphatase: 53 U/L (ref 38–126)
Anion gap: 14 (ref 5–15)
BUN: 13 mg/dL (ref 6–20)
CO2: 23 mmol/L (ref 22–32)
Calcium: 9.4 mg/dL (ref 8.9–10.3)
Chloride: 102 mmol/L (ref 98–111)
Creatinine, Ser: 0.83 mg/dL (ref 0.44–1.00)
GFR calc Af Amer: >60 mL/min (ref >60)
GFR calc non Af Amer: >60 mL/min (ref >60)
Glucose, Bld: 103 mg/dL — ABNORMAL HIGH (ref 70–99)
Potassium: 3.6 mmol/L (ref 3.5–5.1)
Sodium: 139 mmol/L (ref 135–145)
Total Bilirubin: 0.5 mg/dL (ref 0.3–1.2)
Total Protein: 7.3 g/dL (ref 6.5–8.1)

## 2019-10-21 LAB — TROPONIN I (HIGH SENSITIVITY)
Troponin I (High Sensitivity): 3 ng/L (ref <18)
Troponin I (High Sensitivity): 4 ng/L (ref <18)

## 2019-10-21 LAB — LIPASE, BLOOD: Lipase: 35 U/L (ref 11–51)

## 2019-10-21 LAB — ETHANOL: Alcohol, Ethyl (B): <10 mg/dL (ref <10)

## 2019-10-21 LAB — I-STAT BETA HCG BLOOD, ED (MC, WL, AP ONLY): I-stat hCG, quantitative: <5 m[IU]/mL (ref <5)

## 2019-10-21 LAB — ACETAMINOPHEN LEVEL: Acetaminophen (Tylenol), Serum: <10 ug/mL — ABNORMAL LOW (ref 10–30)

## 2019-10-21 LAB — SALICYLATE LEVEL: Salicylate Lvl: <7 mg/dL (ref 2.8–30.0)

## 2019-10-21 MED ORDER — ACETAMINOPHEN 325 MG PO TABS
650.0000 mg | ORAL_TABLET | Freq: Once | ORAL | Status: AC
  Administered 2019-10-21: 650 mg via ORAL
  Filled 2019-10-21: qty 2

## 2019-10-21 MED ORDER — ACETAMINOPHEN 325 MG PO TABS: 650.0000 mg | ORAL_TABLET | Freq: Four times a day (QID) | ORAL | 0 refills | Status: DC | PRN

## 2019-10-21 NOTE — ED Notes (Signed)
Pt refusing blood work at this time and being rude to staff. Pt seems very agitated and wants to speak to MD at this time about previous scans.

## 2019-10-21 NOTE — ED Notes (Signed)
Pt refused blood work in triage.  

## 2019-10-21 NOTE — ED Provider Notes (Signed)
[facility] EM[facility]ENCY [facility] Provider Note   CSN: [id number] Arrival date & time: 10/21/19  1319     History   Chief Complaint Chief Complaint  Patient presents with  . Psychiatric Evaluation    HPI Diane Saunders is a 43 y.o. female.     The history is provided by the patient and medical records. No language interpreter was used.  Chest Pain Pain location:  L chest and L lateral chest Pain quality: sharp   Pain radiates to:  L shoulder and upper back Pain severity:  Severe Onset quality:  Gradual Duration:  1 day Timing:  Sporadic Progression:  Improving Chronicity:  New Relieved by:  Nothing Worsened by:  Nothing Ineffective treatments:  None tried Associated symptoms: abdominal pain and back pain   Associated symptoms: no claudication, no cough, no diaphoresis, no dizziness, no fatigue, no fever, no headache, no heartburn, no lower extremity edema, no nausea, no near-syncope, no numbness, no palpitations, no shortness of breath, no syncope, no vomiting and no weakness     Past Medical History:  Diagnosis Date  . Bipolar disorder (HCC)   . Depression   . Fatty liver   . Heart murmur   . PTSD (post-traumatic stress disorder)     Patient Active Problem List   Diagnosis Date Noted  . Delusional disorder (HCC) 06/27/2019  . Cocaine abuse with cocaine-induced psychotic disorder, with delusions (HCC) 06/27/2019  . Bipolar 1 disorder (HCC) 06/27/2019    History reviewed. No pertinent surgical history.   OB History   No obstetric history on file.      Home Medications    Prior to Admission medications   Medication Sig Start Date End Date Taking? Authorizing Provider  hydrOXYzine (ATARAX/VISTARIL) 25 MG tablet Take 1 tablet (25 mg total) by mouth every 6 (six) hours. 08/06/19   Khatri, Hina, PA-C  naproxen (NAPROSYN) 500 MG tablet Take 1 tablet (500 mg total) by mouth 2 (two) times daily. 08/06/19   Khatri, Hillary BowHina, PA-C  permethrin  (ELIMITE) 5 % cream Apply to affected area once not including face 10/11/19   Virgina Norfolkuratolo, Adam, DO    Family History No family history on file.  Social History Social History   Tobacco Use  . Smoking status: Current Every Day Smoker    Packs/day: 0.50    Types: Cigarettes  . Smokeless tobacco: Never Used  Substance Use Topics  . Alcohol use: Yes    Alcohol/week: 2.0 standard drinks    Types: 2 Cans of beer per week    Comment: Pt reports she drinks "light beer as a diuretic when her foot is swollen"  . Drug use: Yes    Types: Cocaine, Amphetamines, Marijuana    Comment: last use last month on 08/06/2019 assessment     Allergies   Mushroom extract complex   Review of Systems Review of Systems  Constitutional: Negative for chills, diaphoresis, fatigue and fever.  HENT: Negative for congestion.   Eyes: Negative for visual disturbance.  Respiratory: Negative for cough, chest tightness, shortness of breath, wheezing and stridor.   Cardiovascular: Positive for chest pain. Negative for palpitations, claudication, leg swelling, syncope and near-syncope.  Gastrointestinal: Positive for abdominal pain. Negative for constipation, diarrhea, heartburn, nausea and vomiting.  Genitourinary: Negative for dysuria.  Musculoskeletal: Positive for back pain. Negative for neck pain and neck stiffness.  Skin: Positive for rash. Negative for wound.  Neurological: Negative for dizziness, weakness, light-headedness, numbness and headaches.  Psychiatric/Behavioral: Negative for agitation.  All other systems reviewed and are negative.    Physical Exam Updated Vital Signs BP 104/70 (BP Location: Right Arm)   Pulse 68   Temp 98.8 F (37.1 C) (Oral)   Resp 16   Ht 5\' 6"  (1.676 m)   LMP 10/11/2019   SpO2 100%   Physical Exam Vitals signs and nursing note reviewed.  Constitutional:      General: She is not in acute distress.    Appearance: She is well-developed. She is not ill-appearing,  toxic-appearing or diaphoretic.  HENT:     Head: Normocephalic and atraumatic.     Right Ear: External ear normal.     Left Ear: External ear normal.     Nose: Nose normal.  Eyes:     Conjunctiva/sclera: Conjunctivae normal.     Pupils: Pupils are equal, round, and reactive to light.  Neck:     Musculoskeletal: Normal range of motion and neck supple.  Cardiovascular:     Rate and Rhythm: Normal rate.     Pulses: Normal pulses.     Heart sounds: No murmur.  Pulmonary:     Effort: Pulmonary effort is normal. No respiratory distress.     Breath sounds: Normal breath sounds. No stridor. No wheezing, rhonchi or rales.  Chest:     Chest wall: Tenderness present.  Abdominal:     General: Abdomen is flat. Bowel sounds are normal. There is no distension.     Tenderness: There is abdominal tenderness. There is no right CVA tenderness, left CVA tenderness or rebound.    Musculoskeletal:        General: Tenderness present. No signs of injury.     Right lower leg: No edema.     Left lower leg: No edema.  Skin:    General: Skin is warm.     Capillary Refill: Capillary refill takes less than 2 seconds.     Findings: No erythema or rash.  Neurological:     General: No focal deficit present.     Mental Status: She is alert and oriented to person, place, and time.     Sensory: No sensory deficit.     Motor: No weakness or abnormal muscle tone.     Deep Tendon Reflexes: Reflexes are normal and symmetric.  Psychiatric:        Thought Content: Thought content is delusional.      ED Treatments / Results  Labs (all labs ordered are listed, but only abnormal results are displayed) Labs Reviewed  ACETAMINOPHEN LEVEL - Abnormal; Notable for the following components:      Result Value   Acetaminophen (Tylenol), Serum <10 (*)    All other components within normal limits  COMPREHENSIVE METABOLIC PANEL - Abnormal; Notable for the following components:   Glucose, Bld 103 (*)    All other  components within normal limits  ETHANOL  SALICYLATE LEVEL  CBC  LIPASE, BLOOD  RAPID URINE DRUG SCREEN, HOSP PERFORMED  I-STAT BETA HCG BLOOD, ED ([facility], [facility], [facility] ONLY)  TROPONIN I (HIGH SENSITIVITY)  TROPONIN I (HIGH SENSITIVITY)    EKG EKG Interpretation  Date/Time:  Monday October 21 2019 16:48:03 EST Ventricular Rate:  62 PR Interval:  142 QRS Duration: 88 QT Interval:  410 QTC Calculation: 416 R Axis:   70 Text Interpretation: Normal sinus rhythm with sinus arrhythmia Normal ECG When comapred to prior, no significant changes seen. No STEMI Confirmed by [id number] (Theda Belfast) on 10/21/2019 6:01:19 PM   Radiology Dg Chest 2  View  Result Date: 10/21/2019 CLINICAL DATA:  Chest pain EXAM: CHEST - 2 VIEW COMPARISON:  12/16/2004 FINDINGS: The heart size and mediastinal contours are within normal limits. Both lungs are clear. The visualized skeletal structures are unremarkable. IMPRESSION: No active cardiopulmonary disease. Electronically Signed   By: Jasmine Pang M.D.   On: 10/21/2019 18:15    Procedures Procedures (including critical care time)  Medications Ordered in ED Medications  acetaminophen (TYLENOL) tablet 650 mg (650 mg Oral Given 10/21/19 1812)     Initial Impression / Assessment and Plan / ED Course  I have reviewed the triage vital signs and the nursing notes.  Pertinent labs & imaging results that were available during my care of the patient were reviewed by me and considered in my medical decision making (see chart for details).        Diane Saunders is a 43 y.o. 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  She reports that she has been seen many times for the parasites sucking her blood in her skin that cause discomfort.  She reports that she was seen several weeks ago and was started on Keflex for possible cellulitis  of the leg as well as cream to treat if there was scabies.  Patient reports that her symptoms have improved and she thinks that the "parasites" are trying to get out.  She reports using more of them on her skin in her chest specifically.  She says that she was having chest discomfort in her left chest that began overnight.  She reports it is a sharp pain that feels like a needle in her chest.  She reports it does radiate to her shoulder and shoulder blade on her upper left back.  She reports no significant fevers, chills, congestion, cough, shortness of breath, nausea, vomiting, urinary symptoms or GI symptoms.  She reports no other complaints.  No palpitations.  She reports she has completed the medication she was given.  On exam, patient does have areas of excoriation on her skin but I do not see any evidence of parasitic infection leaving her skin.  Did have some tenderness on her chest and her left upper back.  Patient had good pulses in upper extremities.  She is alert and oriented.  Her lungs were clear.  Abdomen had very minimal tenderness in her left upper abdomen.  No focal neurologic deficit seen.  Patient was pointing on her leg and chest some white pieces of material on top of her skin and they appear to be lint or clothing particles, not the parasites she was concerned about.  Initially, patient was going to refuse all work-up until she had her previous work-up explained to her.  I reviewed the patient's chart with her and showed her that she had a negative x-ray and negative DVT study during her last visit.  She did not appear to get blood work done at that time.  It also appears that she refused EKG at that time.  Patient was agreeable to getting blood work, imaging, and EKG today to help work-up her chest pain.  We discussed that in the emergency department, we will try to rule out life-threatening conditions causing her discomfort and symptoms today.  I suspect that the patient's symptoms are  related to her delusions of parasites in her skin versus musculoskeletal etiology.  Anticipate reassessment after work-up.  Work-up  returned overall reassuring.  Delta troponin negative.  Chest x-ray reassuring.  Labs otherwise reassuring.  Patient continued perseverating on the parasites coming out of her skin.  She reports that they fell on the floor and they were asleep by the custodial staff.  She requested the might be tested for the parasites but was informed that we could not do this.  Suspect patient's discomfort are related to the parasitic delusions from her skin.  There were excoriations in the area she was having discomfort, suspect this is causing her symptoms.  Did not see evidence of cellulitis and patient has completed the antibiotics for previous cellulitis.  Do not feel she needs further antibiotics.  Patient was given Tylenol and felt somewhat better.  She requested a prescription for Tylenol which was provided.  Patient reports she is scheduled to follow-up with her primary doctor later this week.  She will go to this appointment.  She understands return precautions for new or worsening symptoms.  Patient was discharged in good condition after reassuring work-up and hours of observation with no further symptoms.  Final Clinical Impressions(s) / ED Diagnoses   Final diagnoses:  Precordial pain  Multiple excoriations    ED Discharge Orders         Ordered    acetaminophen (TYLENOL) 325 MG tablet  Every 6 hours PRN,   Status:  Discontinued     10/21/19 2209    acetaminophen (TYLENOL) 325 MG tablet  Every 6 hours PRN     10/21/19 2209         Clinical Impression: 1. Precordial pain   2. Multiple excoriations     Disposition: Discharge  Condition: Good  I have discussed the results, Dx and Tx plan with the pt(& family if present). He/she/they expressed understanding and agree(s) with the plan. Discharge instructions discussed at great length. Strict return precautions  discussed and pt &/or family have verbalized understanding of the instructions. No further questions at time of discharge.    Discharge Medication List as of 10/21/2019 10:09 PM    START taking these medications   Details  acetaminophen (TYLENOL) 325 MG tablet Take 2 tablets (650 mg total) by mouth every 6 (six) hours as needed., Starting Mon 10/21/2019, Normal        Follow Up: Your PCP this week        Joani Cosma, Gwenyth Allegra, MD 10/21/19 2255

## 2019-10-21 NOTE — Discharge Instructions (Signed)
Your work-up today was overall reassuring.  We did not see evidence of cardiac injury causing your chest discomfort.  We did not see significant other lab abnormalities and your chest x-ray was reassuring.  We reviewed your previous work-up showing no evidence of blood clot in the ultrasound or bony abnormality on the x-ray.  Based on your stability

## 2019-10-21 NOTE — ED Triage Notes (Signed)
Pt arrives pov with reports of "parasitic bug sucking her blood" onset 11 months ago. States it was in her neck.

## 2019-10-21 NOTE — ED Notes (Signed)
No answer for triage.

## 2019-10-27 ENCOUNTER — Emergency Department (HOSPITAL_COMMUNITY)
Admission: EM | Admit: 2019-10-27 | Discharge: 2019-10-28 | Disposition: A | Discharge status: Home / Self Care | Payer: Medicaid Other | Attending: Emergency Medicine | Admitting: Emergency Medicine

## 2019-10-27 ENCOUNTER — Encounter (HOSPITAL_COMMUNITY): Payer: Self-pay | Admitting: Emergency Medicine

## 2019-10-27 DIAGNOSIS — F17210 Nicotine dependence, cigarettes, uncomplicated: Secondary | ICD-10-CM | POA: Insufficient documentation

## 2019-10-27 DIAGNOSIS — F14150 Cocaine abuse with cocaine-induced psychotic disorder with delusions: Secondary | ICD-10-CM | POA: Diagnosis not present

## 2019-10-27 DIAGNOSIS — Z79899 Other long term (current) drug therapy: Secondary | ICD-10-CM | POA: Insufficient documentation

## 2019-10-27 DIAGNOSIS — Z20828 Contact with and (suspected) exposure to other viral communicable diseases: Secondary | ICD-10-CM | POA: Insufficient documentation

## 2019-10-27 DIAGNOSIS — Z590 Homelessness: Secondary | ICD-10-CM | POA: Diagnosis not present

## 2019-10-27 DIAGNOSIS — M79606 Pain in leg, unspecified: Secondary | ICD-10-CM | POA: Diagnosis present

## 2019-10-27 MED ORDER — ZIPRASIDONE MESYLATE 20 MG IM SOLR
20.0000 mg | Freq: Once | INTRAMUSCULAR | Status: AC
  Administered 2019-10-28: 20 mg via INTRAMUSCULAR
  Filled 2019-10-27: qty 20

## 2019-10-27 MED ORDER — STERILE WATER FOR INJECTION IJ SOLN
INTRAMUSCULAR | Status: AC
  Administered 2019-10-28: via INTRAMUSCULAR
  Filled 2019-10-27: qty 10

## 2019-10-27 NOTE — ED Notes (Signed)
Pt continues to walk around away from lobby. Triage staff has been out several times to call pt but pt has gone to the cafe then outside. She is outside at this moment.

## 2019-10-27 NOTE — ED Notes (Signed)
Unable to locate patient at waiting area. 

## 2019-10-27 NOTE — ED Triage Notes (Signed)
Pt transported from laundry mat by [facility] for c/o leg pain and parasites coming out of her skin. No bugs seen by EMS, only lint.

## 2019-10-27 NOTE — ED Triage Notes (Addendum)
Pt states she is still having parasite come out of lower extremities and today she was hitting back on wall and something jumped out of her spinal column and the babies ran down her legs.  Pt also states parasites are worse when she eats food, because they like chicken, pt states he was eating chicken recently at the bus stop and she witnessed the parasites jump out of her body and attack the chicken.

## 2019-10-27 NOTE — ED Notes (Signed)
Pt has not returned to [place] from outside. All pts belongings noted to be in the waiting area. Security notified

## 2019-10-27 NOTE — ED Notes (Signed)
Pt currently eating large meal in waiting area, will triage after

## 2019-10-28 ENCOUNTER — Inpatient Hospital Stay (HOSPITAL_COMMUNITY): Admission: RE | Admit: 2019-10-28 | Payer: Medicaid Other | Source: Intra-hospital | Admitting: Psychiatry

## 2019-10-28 DIAGNOSIS — Z590 Homelessness: Secondary | ICD-10-CM

## 2019-10-28 DIAGNOSIS — F14150 Cocaine abuse with cocaine-induced psychotic disorder with delusions: Secondary | ICD-10-CM

## 2019-10-28 LAB — CBC WITH DIFFERENTIAL/PLATELET
Abs Immature Granulocytes: 0.01 10*3/uL (ref 0.00–0.07)
Basophils Absolute: 0 10*3/uL (ref 0.0–0.1)
Basophils Relative: 0 %
Eosinophils Absolute: 0.2 10*3/uL (ref 0.0–0.5)
Eosinophils Relative: 2 %
HCT: 37.9 % (ref 36.0–46.0)
Hemoglobin: 12.1 g/dL (ref 12.0–15.0)
Immature Granulocytes: 0 %
Lymphocytes Relative: 57 %
Lymphs Abs: 4.2 10*3/uL — ABNORMAL HIGH (ref 0.7–4.0)
MCH: 27.2 pg (ref 26.0–34.0)
MCHC: 31.9 g/dL (ref 30.0–36.0)
MCV: 85.2 fL (ref 80.0–100.0)
Monocytes Absolute: 0.6 10*3/uL (ref 0.1–1.0)
Monocytes Relative: 8 %
Neutro Abs: 2.4 10*3/uL (ref 1.7–7.7)
Neutrophils Relative %: 33 %
Platelets: 306 10*3/uL (ref 150–400)
RBC: 4.45 MIL/uL (ref 3.87–5.11)
RDW: 13.5 % (ref 11.5–15.5)
WBC: 7.4 10*3/uL (ref 4.0–10.5)
nRBC: 0 % (ref 0.0–0.2)

## 2019-10-28 LAB — COMPREHENSIVE METABOLIC PANEL
ALT: 17 U/L (ref 0–44)
AST: 17 U/L (ref 15–41)
Albumin: 3.2 g/dL — ABNORMAL LOW (ref 3.5–5.0)
Alkaline Phosphatase: 47 U/L (ref 38–126)
Anion gap: 8 (ref 5–15)
BUN: 12 mg/dL (ref 6–20)
CO2: 25 mmol/L (ref 22–32)
Calcium: 9.5 mg/dL (ref 8.9–10.3)
Chloride: 109 mmol/L (ref 98–111)
Creatinine, Ser: 0.85 mg/dL (ref 0.44–1.00)
GFR calc Af Amer: >60 mL/min (ref >60)
GFR calc non Af Amer: >60 mL/min (ref >60)
Glucose, Bld: 89 mg/dL (ref 70–99)
Potassium: 4.4 mmol/L (ref 3.5–5.1)
Sodium: 142 mmol/L (ref 135–145)
Total Bilirubin: 0.1 mg/dL — ABNORMAL LOW (ref 0.3–1.2)
Total Protein: 6.6 g/dL (ref 6.5–8.1)

## 2019-10-28 LAB — ETHANOL: Alcohol, Ethyl (B): <10 mg/dL (ref <10)

## 2019-10-28 LAB — SALICYLATE LEVEL: Salicylate Lvl: <7 mg/dL (ref 2.8–30.0)

## 2019-10-28 LAB — ACETAMINOPHEN LEVEL: Acetaminophen (Tylenol), Serum: <10 ug/mL — ABNORMAL LOW (ref 10–30)

## 2019-10-28 LAB — SARS CORONAVIRUS 2 BY RT PCR (HOSPITAL ORDER, PERFORMED IN ~~LOC~~ HOSPITAL LAB): SARS Coronavirus 2: NEGATIVE

## 2019-10-28 NOTE — ED Notes (Signed)
IVC paperwork rescinded 

## 2019-10-28 NOTE — ED Provider Notes (Signed)
Pt has been cleared for d/c by psych.  Return if worse.  F/u with [facility].   Isla Pence, MD 10/28/19 [phone]

## 2019-10-28 NOTE — ED Notes (Signed)
Patient verbalizes understanding of discharge instructions. Opportunity for questioning and answers were provided. Armband removed by staff, pt discharged from ED.  

## 2019-10-28 NOTE — ED Notes (Signed)
This RN and English as a second language teacher

## 2019-10-28 NOTE — ED Notes (Signed)
IVC Breakfast ordered 

## 2019-10-28 NOTE — ED Notes (Addendum)
Pt exits the room without explaining. Pt states "I have the right to go anywhere I want to". Security called. Pt walks to the charge desk and asks for food and drink. Pt walks back to room and ask for lotion, pointing at a piece of dry skin falling on the floor. Pt resting at the moment after being given food and drink.

## 2019-10-28 NOTE — Discharge Instructions (Signed)
[facility] ° °Mental health service in [place], [place] °COVID-19 info: [email or web address] °Get online care: [email or web address] °Address: [address], [place], [place] [address] °Hours:  °Open ? Closes 7PM °Phone: [phone] °

## 2019-10-28 NOTE — ED Provider Notes (Signed)
[place] EMERGENCY DEPARTMENT Provider Note   CSN: [id number] Arrival date & time: 10/27/19  1926     History   Chief Complaint Chief Complaint  Patient presents with  . Leg Pain    HPI Diane Saunders is a 43 y.o. female.     Patient with past medical history notable for bipolar disorder, PTSD, cocaine induced psychotic disorder, and delusional disorder, presents to the emergency department with a chief complaint of claiming that parasites are coming out of her skin.  She states they have been coming out of her skin for quite some time.  He states that they jumped out and are approximately 1 to 2 inches in size and then splatter and burst into water when they hit the ground.  She states that they will jump out and attacked people.  Prior notes show that she has tried to drink bleach in the past to get rid of the parasites.  She denies suicidal or homicidal thoughts.  The history is provided by the patient. No language interpreter was used.    Past Medical History:  Diagnosis Date  . Bipolar disorder ([place])   . Depression   . Fatty liver   . Heart murmur   . PTSD (post-traumatic stress disorder)     Patient Active Problem List   Diagnosis Date Noted  . Delusional disorder ([place]) 06/27/2019  . Cocaine abuse with cocaine-induced psychotic disorder, with delusions ([place]) 06/27/2019  . Bipolar 1 disorder ([place]) 06/27/2019    History reviewed. No pertinent surgical history.   OB History   No obstetric history on file.      Home Medications    Prior to Admission medications   Medication Sig Start Date End Date Taking? Authorizing Provider  acetaminophen (TYLENOL) 325 MG tablet Take 2 tablets (650 mg total) by mouth every 6 (six) hours as needed. Patient taking differently: Take 650 mg by mouth every 6 (six) hours as needed for mild pain or headache.  10/21/19  Yes Tegeler, Gwenyth Allegra, MD  naproxen (NAPROSYN) 500 MG tablet Take 1 tablet (500 mg  total) by mouth 2 (two) times daily. Patient taking differently: Take 500 mg by mouth 2 (two) times daily as needed for mild pain.  08/06/19  Yes Khatri, Hina, PA-C  permethrin (ELIMITE) 5 % cream Apply to affected area once not including face 10/11/19  Yes Curatolo, Adam, DO  hydrOXYzine (ATARAX/VISTARIL) 25 MG tablet Take 1 tablet (25 mg total) by mouth every 6 (six) hours. Patient not taking: Reported on 10/27/2019 08/06/19   Delia Heady, PA-C    Family History No family history on file.  Social History Social History   Tobacco Use  . Smoking status: Current Every Day Smoker    Packs/day: 0.50    Types: Cigarettes  . Smokeless tobacco: Never Used  Substance Use Topics  . Alcohol use: Yes    Alcohol/week: 2.0 standard drinks    Types: 2 Cans of beer per week    Comment: Pt reports she drinks "light beer as a diuretic when her foot is swollen"  . Drug use: Yes    Types: Cocaine, Amphetamines, Marijuana    Comment: last use last month on 08/06/2019 assessment     Allergies   Mushroom extract complex   Review of Systems Review of Systems  All other systems reviewed and are negative.    Physical Exam Updated Vital Signs BP 120/64 (BP Location: Right Arm)   Pulse 71   Temp 98.4  F (36.9 C) (Oral)   Resp 16   Ht 5\' 6"  (1.676 m)   Wt 81.8 kg   LMP 10/11/2019   SpO2 100%   BMI 29.11 kg/m   Physical Exam Vitals signs and nursing note reviewed.  Constitutional:      General: She is not in acute distress.    Appearance: She is well-developed.  HENT:     Head: Normocephalic and atraumatic.  Eyes:     Conjunctiva/sclera: Conjunctivae normal.  Neck:     Musculoskeletal: Neck supple.  Cardiovascular:     Rate and Rhythm: Normal rate and regular rhythm.     Heart sounds: No murmur.  Pulmonary:     Effort: Pulmonary effort is normal. No respiratory distress.     Breath sounds: Normal breath sounds.  Abdominal:     Palpations: Abdomen is soft.     Tenderness:  There is no abdominal tenderness.  Skin:    General: Skin is warm and dry.     Comments: Scattered excoriations on chest, back, and extremities, nothing that appears infected  Neurological:     Mental Status: She is alert.  Psychiatric:     Comments: Seems manic, irritable, tangential and racing thoughts, throwing off all of her close      ED Treatments / Results  Labs (all labs ordered are listed, but only abnormal results are displayed) Labs Reviewed  I-STAT BETA HCG BLOOD, ED ([facility], [facility], [facility] ONLY)    EKG None  Radiology No results found.  Procedures Procedures (including critical care time)  Medications Ordered in ED Medications  ziprasidone (GEODON) injection 20 mg (has no administration in time range)  sterile water (preservative free) injection (has no administration in time range)     Initial Impression / Assessment and Plan / ED Course  I have reviewed the triage vital signs and the nursing notes.  Pertinent labs & imaging results that were available during my care of the patient were reviewed by me and considered in my medical decision making (see chart for details).        This patient has significant history for delusional disorder, bipolar, and cocaine induced psychosis.  In the patient's current state of mind, she seems very manic, is very agitated, yelling at me when I asked her if she is seeing a psychiatrist.  She then proceeds to discuss how she just needs to get the parasites removed and then she can get a job at [employer] and help treat people for the same problems.  Immediately upon seeing the patient, I expressed my concerns to Dr. Elesa MassedWard.  I do feel that the patient has either manic or suffering from acute psychosis, possibly secondary to cocaine use as she has documented in the past.  Given the patient's current mental state, I do feel that she could be a danger to herself or to others.  Acting and what I believed to be the patient's best interest, the  patient will be placed under involuntary commitment so that she can be seen by psychiatry.  I do not feel that the patient currently has capacity to make her own medical decisions, and I do not believe her to be thinking clearly or be in her right state of mind.  12:08 AM Patient given Geodon for sedation for patient and staff safety.  We will check basic labs, UDS, and will consult TTS for recommendations.  Discharge pending TTS/psychiatry consultation.  Final Clinical Impressions(s) / ED Diagnoses   Final diagnoses:  None    ED Discharge Orders    None       Roxy Horseman, PA-C 10/28/19 0549    Ward, Layla Maw, DO 10/28/19 [phone]

## 2019-10-28 NOTE — ED Notes (Signed)
Attempted to contact [facility] social worker to inform them of patient's discharge and no longer needing the bed at [facility]. Left message with this RN's phone number.

## 2019-10-28 NOTE — ED Notes (Signed)
Per previous RN belongings in locker #10, Duffle bag did not fit in locker so it was placed right outside of locker 10

## 2019-10-28 NOTE — BH Assessment (Signed)
Received TTS consult request. Spoke to Anderson Malta, RN who said Pt is being given medication and is unable to participate in assessment at this time. Pt is also being placed under IVC.   Evelena Peat, [facility], [facility] Triage Specialist [phone]

## 2019-10-28 NOTE — Progress Notes (Signed)
Pt accepted to [facility]; bed 502-1  Dr. Dwyane Dee is the accepting provider.    Dr. Jake Samples is the attending provider.    Call report to [place] @ [facility] ED notified.     Pt is involuntary and will be transported by law enforcement.    Pt is scheduled to arrive at [facility] at [place], [place], [place] Disposition CS[facility]/TTS [phone]

## 2019-10-28 NOTE — Consult Note (Addendum)
[facility]  10/28/2019 10:31 AM Diane NoaMakia K Colaizzi  MRN:  [id number] Principal Problem: Cocaine abuse with cocaine induced psychosis Discharge Diagnoses:   Delusional disorder  Subjective: "The worms only try to get out of my skin when it gets warm."  Patient was seen and evaluated in person by this provider.  She continues to have fixed delusions of worms being under her skin.  Less concern at this time as it is cold and evidently do not try to get out when it is cold only when it is warm.  She does report getting [facility] at [facility] but not taking any medications nor does she feel like she needs any.  No suicidal or homicidal ideations.  Patient does report use of cocaine and methamphetamines.  Educated patient on the effects of this stimulants and hallucinations.  She is not interested in rehab at this time.  No other issues noted except being homeless.  Social work consult placed for housing opportunities.  Stable at this time for discharge as her delusions are chronic and fixated.  Per admission note: Patient with past medical history notable for bipolar disorder, PTSD, cocaine induced psychotic disorder, and delusional disorder, presents to the emergency department with a chief complaint of claiming that parasites are coming out of her skin.  She states they have been coming out of her skin for quite some time.  He states that they jumped out and are approximately 1 to 2 inches in size and then splatter and burst into water when they hit the ground.  She states that they will jump out and attacked people.  Prior notes show that she has tried to drink bleach in the past to get rid of the parasites.  She denies suicidal or homicidal thoughts.  Total Time spent with patient: 1 hour  Past Psychiatric History: delusions, bipolar d/o  Past Medical History:  Past Medical History:  Diagnosis Date  . Bipolar disorder (HCC)   . Depression   . Fatty liver   . Heart murmur   . PTSD  (post-traumatic stress disorder)    History reviewed. No pertinent surgical history. Family History: No family history on file. Family Psychiatric  History: none Social History:  Social History   Substance and Sexual Activity  Alcohol Use Yes  . Alcohol/week: 2.0 standard drinks  . Types: 2 Cans of beer per week   Comment: Pt reports she drinks "light beer as a diuretic when her foot is swollen"     Social History   Substance and Sexual Activity  Drug Use Yes  . Types: Cocaine, Amphetamines, Marijuana   Comment: last use last month on 08/06/2019 assessment    Social History   Socioeconomic History  . Marital status: Divorced    Spouse name: Not on file  . Number of children: Not on file  . Years of education: Not on file  . Highest education level: Not on file  Occupational History  . Not on file  Social Needs  . Financial resource strain: Not on file  . Food insecurity    Worry: Not on file    Inability: Not on file  . Transportation needs    Medical: Not on file    Non-medical: Not on file  Tobacco Use  . Smoking status: Current Every Day Smoker    Packs/day: 0.50    Types: Cigarettes  . Smokeless tobacco: Never Used  Substance and Sexual Activity  . Alcohol use: Yes    Alcohol/week: 2.0  standard drinks    Types: 2 Cans of beer per week    Comment: Pt reports she drinks "light beer as a diuretic when her foot is swollen"  . Drug use: Yes    Types: Cocaine, Amphetamines, Marijuana    Comment: last use last month on 08/06/2019 assessment  . Sexual activity: Yes    Birth control/protection: Condom  Lifestyle  . Physical activity    Days per week: Not on file    Minutes per session: Not on file  . Stress: Not on file  Relationships  . Social Musician on phone: Not on file    Gets together: Not on file    Attends religious service: Not on file    Active member of club or organization: Not on file    Attends meetings of clubs or organizations: Not  on file    Relationship status: Not on file  Other Topics Concern  . Not on file  Social History Narrative  . Not on file    Has this patient used any form of tobacco in the last 30 days? (Cigarettes, Smokeless Tobacco, Cigars, and/or Pipes) A prescription for an FDA-approved tobacco cessation medication was offered at discharge and the patient refused  Current Medications: No current facility-administered medications for this encounter.    Current Outpatient Medications  Medication Sig Dispense Refill  . acetaminophen (TYLENOL) 325 MG tablet Take 2 tablets (650 mg total) by mouth every 6 (six) hours as needed. (Patient taking differently: Take 650 mg by mouth every 6 (six) hours as needed for mild pain or headache. ) 21 tablet 0  . naproxen (NAPROSYN) 500 MG tablet Take 1 tablet (500 mg total) by mouth 2 (two) times daily. (Patient taking differently: Take 500 mg by mouth 2 (two) times daily as needed for mild pain. ) 30 tablet 0  . permethrin (ELIMITE) 5 % cream Apply to affected area once not including face 60 g 0  . hydrOXYzine (ATARAX/VISTARIL) 25 MG tablet Take 1 tablet (25 mg total) by mouth every 6 (six) hours. (Patient not taking: Reported on 10/27/2019) 6 tablet 0   PTA Medications: (Not in a hospital admission)   Musculoskeletal: Strength & Muscle Tone: within normal limits Gait & Station: normal Patient leans: N/A  Psychiatric Specialty Exam: Physical Exam  Nursing note and vitals reviewed. Constitutional: She is oriented to person, place, and time. She appears well-developed and well-nourished.  HENT:  Head: Normocephalic.  Neck: Normal range of motion.  Respiratory: Effort normal.  Musculoskeletal: Normal range of motion.  Neurological: She is alert and oriented to person, place, and time.  Psychiatric: Her speech is normal and behavior is normal. Judgment normal. Her mood appears anxious. Thought content is delusional. Cognition and memory are normal.    Review  of Systems  Psychiatric/Behavioral: The patient is nervous/anxious.   All other systems reviewed and are negative.   Blood pressure 115/60, pulse 68, temperature 98.4 F (36.9 C), temperature source Oral, resp. rate 18, height 5\' 6"  (1.676 m), weight 81.8 kg, last menstrual period 10/11/2019, SpO2 99 %.Body mass index is 29.11 kg/m.  General Appearance: Casual  Eye Contact:  Good  Speech:  Normal Rate  Volume:  Normal  Mood:  Anxious  Affect:  Congruent  Thought Process:  Coherent and Descriptions of Associations: Intact  Orientation:  Full (Time, Place, and Person)  Thought Content:  WDL and Logical  Suicidal Thoughts:  No  Homicidal Thoughts:  No  Memory:  Immediate;   Good Recent;   Good Remote;   Good  Judgement:  Fair  Insight:  Fair  Psychomotor Activity:  Normal  Concentration:  Concentration: Good and Attention Span: Good  Recall:  Good  Fund of Knowledge:  Good  Language:  Good  Akathisia:  No  Handed:  Right  AIMS (if indicated):     Assets:  Leisure Time Physical Health Resilience Social Support  ADL's:  Intact  Cognition:  WNL  Sleep:        Demographic Factors:  NA  Loss Factors: NA  Historical Factors: NA  Risk Reduction Factors:   Sense of responsibility to family and Positive social support  Continued Clinical Symptoms:  Anxiety, mild  Cognitive Features That Contribute To Risk:  None    Suicide Risk:  Minimal: No identifiable suicidal ideation.  Patients presenting with no risk factors but with morbid ruminations; may be classified as minimal risk based on the severity of the depressive symptoms    Plan Of Care/Follow-up recommendations:  Delusional disorder: -Follow up with [facility]  Stimulant use disorder: -recommended rehab, patient declined Activity:  as tolerated Diet:  heart healthy diet  Disposition: Discharge home Waylan Boga, NP 10/28/2019, 10:31 AM

## 2019-10-28 NOTE — ED Notes (Signed)
All patient belongings returned to patient including 4 bags of belongings and a valuables envelope from security.

## 2019-10-30 ENCOUNTER — Encounter (HOSPITAL_COMMUNITY): Payer: Self-pay | Admitting: Emergency Medicine

## 2019-10-30 ENCOUNTER — Other Ambulatory Visit: Payer: Self-pay

## 2019-10-30 ENCOUNTER — Emergency Department (HOSPITAL_COMMUNITY)
Admission: EM | Admit: 2019-10-30 | Discharge: 2019-10-31 | Disposition: A | Discharge status: Other Acute Inpatient Hospital | Payer: Medicaid Other | Source: Home / Self Care | Attending: Emergency Medicine | Admitting: Emergency Medicine

## 2019-10-30 ENCOUNTER — Emergency Department (HOSPITAL_COMMUNITY)
Admission: EM | Admit: 2019-10-30 | Discharge: 2019-10-30 | Disposition: A | Discharge status: Home / Self Care | Payer: Medicaid Other | Attending: Emergency Medicine | Admitting: Emergency Medicine

## 2019-10-30 ENCOUNTER — Emergency Department (HOSPITAL_COMMUNITY): Payer: Medicaid Other

## 2019-10-30 DIAGNOSIS — F1490 Cocaine use, unspecified, uncomplicated: Secondary | ICD-10-CM | POA: Insufficient documentation

## 2019-10-30 DIAGNOSIS — F17210 Nicotine dependence, cigarettes, uncomplicated: Secondary | ICD-10-CM | POA: Insufficient documentation

## 2019-10-30 DIAGNOSIS — F22 Delusional disorders: Secondary | ICD-10-CM

## 2019-10-30 DIAGNOSIS — B889 Infestation, unspecified: Secondary | ICD-10-CM | POA: Diagnosis present

## 2019-10-30 DIAGNOSIS — F319 Bipolar disorder, unspecified: Secondary | ICD-10-CM | POA: Diagnosis not present

## 2019-10-30 DIAGNOSIS — F4310 Post-traumatic stress disorder, unspecified: Secondary | ICD-10-CM | POA: Diagnosis not present

## 2019-10-30 LAB — COMPREHENSIVE METABOLIC PANEL
ALT: 18 U/L (ref 0–44)
AST: 18 U/L (ref 15–41)
Albumin: 3.3 g/dL — ABNORMAL LOW (ref 3.5–5.0)
Alkaline Phosphatase: 45 U/L (ref 38–126)
Anion gap: 10 (ref 5–15)
BUN: 12 mg/dL (ref 6–20)
CO2: 23 mmol/L (ref 22–32)
Calcium: 8.9 mg/dL (ref 8.9–10.3)
Chloride: 107 mmol/L (ref 98–111)
Creatinine, Ser: 0.74 mg/dL (ref 0.44–1.00)
GFR calc Af Amer: >60 mL/min (ref >60)
GFR calc non Af Amer: >60 mL/min (ref >60)
Glucose, Bld: 141 mg/dL — ABNORMAL HIGH (ref 70–99)
Potassium: 3.7 mmol/L (ref 3.5–5.1)
Sodium: 140 mmol/L (ref 135–145)
Total Bilirubin: 0.2 mg/dL — ABNORMAL LOW (ref 0.3–1.2)
Total Protein: 6.5 g/dL (ref 6.5–8.1)

## 2019-10-30 LAB — RAPID URINE DRUG SCREEN, HOSP PERFORMED
Amphetamines: NOT DETECTED
Barbiturates: NOT DETECTED
Benzodiazepines: NOT DETECTED
Cocaine: NOT DETECTED
Opiates: NOT DETECTED
Tetrahydrocannabinol: NOT DETECTED

## 2019-10-30 LAB — CBC WITH DIFFERENTIAL/PLATELET
Abs Immature Granulocytes: 0.02 10*3/uL (ref 0.00–0.07)
Basophils Absolute: 0 10*3/uL (ref 0.0–0.1)
Basophils Relative: 0 %
Eosinophils Absolute: 0.1 10*3/uL (ref 0.0–0.5)
Eosinophils Relative: 2 %
HCT: 35.5 % — ABNORMAL LOW (ref 36.0–46.0)
Hemoglobin: 11 g/dL — ABNORMAL LOW (ref 12.0–15.0)
Immature Granulocytes: 0 %
Lymphocytes Relative: 54 %
Lymphs Abs: 3.8 10*3/uL (ref 0.7–4.0)
MCH: 27 pg (ref 26.0–34.0)
MCHC: 31 g/dL (ref 30.0–36.0)
MCV: 87 fL (ref 80.0–100.0)
Monocytes Absolute: 0.4 10*3/uL (ref 0.1–1.0)
Monocytes Relative: 6 %
Neutro Abs: 2.6 10*3/uL (ref 1.7–7.7)
Neutrophils Relative %: 38 %
Platelets: 279 10*3/uL (ref 150–400)
RBC: 4.08 MIL/uL (ref 3.87–5.11)
RDW: 13.9 % (ref 11.5–15.5)
WBC: 7 10*3/uL (ref 4.0–10.5)
nRBC: 0 % (ref 0.0–0.2)

## 2019-10-30 LAB — ETHANOL: Alcohol, Ethyl (B): <10 mg/dL (ref <10)

## 2019-10-30 LAB — I-STAT BETA HCG BLOOD, ED (MC, WL, AP ONLY): I-stat hCG, quantitative: <5 m[IU]/mL (ref <5)

## 2019-10-30 MED ORDER — ZIPRASIDONE MESYLATE 20 MG IM SOLR
10.0000 mg | Freq: Once | INTRAMUSCULAR | Status: AC
  Administered 2019-10-30: 10 mg via INTRAMUSCULAR
  Filled 2019-10-30: qty 20

## 2019-10-30 MED ORDER — WHITE PETROLATUM EX OINT
TOPICAL_OINTMENT | CUTANEOUS | Status: DC | PRN
  Filled 2019-10-30: qty 5

## 2019-10-30 MED ORDER — STERILE WATER FOR INJECTION IJ SOLN
INTRAMUSCULAR | Status: AC
  Administered 2019-10-30: 20:00:00
  Filled 2019-10-30: qty 10

## 2019-10-30 MED ORDER — WHITE PETROLATUM EX OINT: 1.0000 "application " | TOPICAL_OINTMENT | Freq: Every day | CUTANEOUS | 0 refills | Status: DC

## 2019-10-30 NOTE — ED Notes (Signed)
Social worker in talking with patient

## 2019-10-30 NOTE — ED Provider Notes (Signed)
[place] [facility] EMERGENCY DEPARTMENT Provider Note   CSN: [id number] Arrival date & time: 10/30/19  2671     History   Chief Complaint Chief Complaint  Patient presents with  . Back Pain    HPI Diane Saunders is a 43 y.o. female.  With past medical history significant for depression, bipolar disorder, PTSD, polysubstance abuse, and hx of fixed delusion of parasites in her skin who presents for evaluation of parasites in her back.  Patient reports she has a parasite coming out of her back, she hit her back on a wall, and something jumped out then fell down on her left buttock.  Parasites are crawling on her arms , back, and medial aspect of thighs.  The parasites are coming out in small pieces, but not coming out in whole pieces. She is pointing at various body parts and her clothes asking if I see the parasites. When asked about substance use, patient reports she snorted a small amount of cocaine once in the past 2 weeks and drank a few drinks of alcohol.  Denies suicidal or homicidal thoughts. Denies fever, chills, SOB, cough, and chest pain.    HPI  Past Medical History:  Diagnosis Date  . Bipolar disorder ([place])   . Depression   . Fatty liver   . Heart murmur   . PTSD (post-traumatic stress disorder)     Patient Active Problem List   Diagnosis Date Noted  . Delusional disorder ([place]) 06/27/2019  . Cocaine abuse with cocaine-induced psychotic disorder, with delusions ([place]) 06/27/2019  . Bipolar 1 disorder ([place]) 06/27/2019    History reviewed. No pertinent surgical history.   OB History   No obstetric history on file.      Home Medications    Prior to Admission medications   Medication Sig Start Date End Date Taking? Authorizing Provider  acetaminophen (TYLENOL) 325 MG tablet Take 2 tablets (650 mg total) by mouth every 6 (six) hours as needed. Patient taking differently: Take 650 mg by mouth every 6 (six) hours as needed for mild pain or headache.   10/21/19   Tegeler, Gwenyth Allegra, MD  hydrOXYzine (ATARAX/VISTARIL) 25 MG tablet Take 1 tablet (25 mg total) by mouth every 6 (six) hours. Patient not taking: Reported on 10/27/2019 08/06/19   Delia Heady, PA-C  naproxen (NAPROSYN) 500 MG tablet Take 1 tablet (500 mg total) by mouth 2 (two) times daily. Patient taking differently: Take 500 mg by mouth 2 (two) times daily as needed for mild pain.  08/06/19   Delia Heady, PA-C    Family History No family history on file.  Social History Social History   Tobacco Use  . Smoking status: Current Every Day Smoker    Packs/day: 0.50    Types: Cigarettes  . Smokeless tobacco: Never Used  Substance Use Topics  . Alcohol use: Yes    Alcohol/week: 2.0 standard drinks    Types: 2 Cans of beer per week    Comment: Pt reports she drinks "light beer as a diuretic when her foot is swollen"  . Drug use: Yes    Types: Cocaine, Amphetamines, Marijuana    Comment: last use last month on 08/06/2019 assessment     Allergies   Mushroom extract complex   Review of Systems Review of Systems  All other systems reviewed and are negative.    Physical Exam Updated Vital Signs BP 115/66   Pulse 96   Temp 98 F (36.7 C) (Oral)   Resp  17   Ht 5\' 6"  (1.676 m)   Wt 81.6 kg   LMP 10/11/2019   SpO2 97%   BMI 29.05 kg/m   Physical Exam Constitutional:      Appearance: She is not ill-appearing or toxic-appearing.  Eyes:     General: No scleral icterus.    Extraocular Movements: Extraocular movements intact.  Cardiovascular:     Rate and Rhythm: Normal rate and regular rhythm.     Heart sounds: Normal heart sounds. No murmur. No friction rub. No gallop.   Pulmonary:     Breath sounds: Normal breath sounds. No wheezing, rhonchi or rales.  Abdominal:     Comments: Patient does not allow for evaluation of abdomen, says her abdomen is fine.   Musculoskeletal:        General: No deformity or signs of injury.  Skin:    General: Skin is warm and  dry.     Findings: Lesion present.     Comments: Two crusted wounds in reach of patient on superior back. Excoriations on arms, thighs.   Neurological:     General: No focal deficit present.     Mental Status: Mental status is at baseline.  Psychiatric:     Comments: Speech is pressured, behavior is abnormal , though content is delusional .      ED Treatments / Results  Labs (all labs ordered are listed, but only abnormal results are displayed) Labs Reviewed  RAPID URINE DRUG SCREEN, HOSP PERFORMED  I-STAT BETA HCG BLOOD, ED ([facility], [facility], [facility] ONLY)    EKG EKG Interpretation  Date/Time:  Wednesday October 30 2019 09:07:15 EST Ventricular Rate:  69 PR Interval:    QRS Duration: 78 QT Interval:  410 QTC Calculation: 440 R Axis:   71 Text Interpretation: Sinus rhythm Probable left atrial enlargement ST elev, probable normal early repol pattern No acute changes Confirmed by Derwood KaplanNanavati, Ankit [phone]([id number]) on 10/30/2019 9:09:29 AM   Radiology Dg Thoracic Spine 2 View  Result Date: 10/30/2019 CLINICAL DATA:  Chronic upper back pain. EXAM: THORACIC SPINE 2 VIEWS COMPARISON:  None. FINDINGS: There is no evidence of thoracic spine fracture. Alignment is normal. No other significant bone abnormalities are identified. IMPRESSION: Negative. Electronically Signed   By: Lupita RaiderJames  Green Jr M.D.   On: 10/30/2019 09:24    Procedures Procedures (including critical care time)  Medications Ordered in ED Medications  white petrolatum (VASELINE) gel (has no administration in time range)     Initial Impression / Assessment an  d Plan / ED Course  I have reviewed the triage vital signs and the nursing notes.  Pertinent labs & imaging results that were available during my care of the patient were reviewed by me and considered in my medical decision making (see chart for details).  Diane Saunders is a 43 year old female who presents for evaluation of parasites beneath her skin.  History of cocaine and  methamphetamine use.  Patient previously presented for this condition 2 days ago, she was evaluated by psychiatry at this timeand discharged from ED with plan for [facility] for fixed delusions.  Presentation is consistent with fixed delusions. Denies infectious symptoms.   Physical exam is benign with exception of excoriations. No sign of cellulitis from excoriations.   Patient reports she does not want to see Psychiatry. She [facility] and [facility] if we xray her back. Xray of thoracic spine showed no bone abnormalities.  Patient  will be discharge with Vaseline for her dry skin and recommended follow up with [facility] and PCP.    Final Clinical Impressions(s) / ED Diagnoses   Final diagnoses:  None    ED Discharge Orders    None       Albertha Ghee, MD 10/30/19 9485    Derwood Kaplan, MD 11/01/19 1939

## 2019-10-30 NOTE — Discharge Instructions (Signed)
Please schedule appointment to be seen by [facility] in the next 1-2 days. In addition you may like to be seen by your PCP in the next week.

## 2019-10-30 NOTE — ED Notes (Signed)
This patient states that something that was inside of her came out on the floor by the entrance to the ED and would like to know if we wanted to scoop it up to evaluate it to see what it was. This Probation officer went outside and saw nothing on the ground.

## 2019-10-30 NOTE — ED Triage Notes (Signed)
Pt st's she had something in her spine and now it has fallen down into her pockets    Pt st's she can't sit down because things might start running all over there floor.

## 2019-10-30 NOTE — ED Notes (Signed)
Pt asks if they are going to do an xray to see what is going on or did they have a chance to scoop up what is outside and take it to lab to be examined. I explained to her there was nothing outside to examine. She then asks is she going to be seen or does she need to get the f off the property. I updated vitals and assured her she would be seen and that they base if off of lab work so she still had a few people in front of her.

## 2019-10-30 NOTE — ED Triage Notes (Signed)
[place] of delusional disorder. Reports that "something" is crawling all over her from her head to feet. Hx of same. Denies cocaine use.

## 2019-10-30 NOTE — Care Management (Signed)
Per request of PT CSW called [facility] in an effort to ascertain status of PT on waitlist for shelter bed.  CSW was unable to reach anyone. Message was left.  CSW reported the status of call to PT.

## 2019-10-30 NOTE — TOC Transition Note (Signed)
Transition of Care [facility]) - CM/SW Discharge Note   Patient Details  Name: Diane Saunders MRN: [id number] Date of Birth: 10-Dec-1975  Transition of Care [facility]) CM/SW Contact:  Vergie Living, LCSW Phone Number: 10/30/2019, 10:08 AM   Clinical Narrative:   CSW met with PT in room.  PT requested that CSW call [place] at [phone] in an effort to find out information about possible housing resources.  CSW called and was unable to speak to anyone via phone CSW left message.  PT stated that she was seeking mental health services at [facility] on a regular basis and that she is more concerned with her physical health due to "things flying out of my butt and the top of my head that that don't show up on x-rays". CSW encouraged PT to keep her next appointment with PCP and [facility]          Patient Goals and CMS Choice        Discharge Placement                       Discharge Plan and Services                                     Social Determinants of Health (SDOH) Interventions     Readmission Risk Interventions No flowsheet data found.

## 2019-10-30 NOTE — ED Provider Notes (Signed)
[facility] DEPT Provider Note   CSN: [id number] Arrival date & time: 10/30/19  1900     History   Chief Complaint Chief Complaint  Patient presents with  . Medical Clearance    HPI RYIAN LYNDE is a 43 y.o. female.     Patient has a history of bipolar and PTSD.  Patient presents with stating that there are bugs crawling out of her body out of her head and this is been going on for months  The history is provided by the patient. No language interpreter was used.  Altered Mental Status Presenting symptoms: behavior changes   Severity:  Severe Most recent episode:  More than 2 days ago Episode history:  Continuous Timing:  Constant Progression:  Worsening Chronicity:  Recurrent Associated symptoms: no abdominal pain, no hallucinations, no headaches, no rash and no seizures     Past Medical History:  Diagnosis Date  . Bipolar disorder ([place])   . Depression   . Fatty liver   . Heart murmur   . PTSD (post-traumatic stress disorder)     Patient Active Problem List   Diagnosis Date Noted  . Delusional disorder ([place]) 06/27/2019  . Cocaine abuse with cocaine-induced psychotic disorder, with delusions ([place]) 06/27/2019  . Bipolar 1 disorder ([place]) 06/27/2019    History reviewed. No pertinent surgical history.   OB History   No obstetric history on file.      Home Medications    Prior to Admission medications   Not on File    Family History No family history on file.  Social History Social History   Tobacco Use  . Smoking status: Current Every Day Smoker    Packs/day: 0.50    Types: Cigarettes  . Smokeless tobacco: Never Used  Substance Use Topics  . Alcohol use: Yes    Alcohol/week: 2.0 standard drinks    Types: 2 Cans of beer per week    Comment: Pt reports she drinks "light beer as a diuretic when her foot is swollen"  . Drug use: Yes    Types: Cocaine, Amphetamines, Marijuana    Comment: last use last month on  08/06/2019 assessment     Allergies   Mushroom extract complex   Review of Systems Review of Systems  Constitutional: Negative for appetite change and fatigue.  HENT: Negative for congestion, ear discharge and sinus pressure.   Eyes: Negative for discharge.  Respiratory: Negative for cough.   Cardiovascular: Negative for chest pain.  Gastrointestinal: Negative for abdominal pain and diarrhea.  Genitourinary: Negative for frequency and hematuria.  Musculoskeletal: Negative for back pain.  Skin: Negative for rash.       Patient states bugs are crawling out of her skin  Neurological: Negative for seizures and headaches.  Psychiatric/Behavioral: Negative for hallucinations.     Physical Exam Updated Vital Signs LMP 10/11/2019   Physical Exam Vitals signs and nursing note reviewed.  Constitutional:      Appearance: She is well-developed.  HENT:     Head: Normocephalic.     Mouth/Throat:     Mouth: Mucous membranes are moist.  Eyes:     General: No scleral icterus.    Conjunctiva/sclera: Conjunctivae normal.  Neck:     Musculoskeletal: Neck supple.     Thyroid: No thyromegaly.  Cardiovascular:     Rate and Rhythm: Normal rate and regular rhythm.     Heart sounds: No murmur. No friction rub. No gallop.   Pulmonary:  Breath sounds: No stridor. No wheezing or rales.  Chest:     Chest wall: No tenderness.  Abdominal:     General: There is no distension.     Tenderness: There is no abdominal tenderness. There is no rebound.  Musculoskeletal: Normal range of motion.  Lymphadenopathy:     Cervical: No cervical adenopathy.  Skin:    Findings: No erythema or rash.  Neurological:     Mental Status: She is alert and oriented to person, place, and time.     Motor: No abnormal muscle tone.     Coordination: Coordination normal.  Psychiatric:     Comments: Patient is not suicidal or homicidal but is having delusions of bugs crawling over      ED Treatments / Results   Labs (all labs ordered are listed, but only abnormal results are displayed) Labs Reviewed  CBC WITH DIFFERENTIAL/PLATELET  COMPREHENSIVE METABOLIC PANEL  URINALYSIS, ROUTINE W REFLEX MICROSCOPIC  PREGNANCY, URINE  RAPID URINE DRUG SCREEN, HOSP PERFORMED  ETHANOL  I-STAT BETA HCG BLOOD, ED ([facility], [facility], [facility] ONLY)    EKG None  Radiology Dg Thoracic Spine 2 View  Result Date: 10/30/2019 CLINICAL DATA:  Chronic upper back pain. EXAM: THORACIC SPINE 2 VIEWS COMPARISON:  None. FINDINGS: There is no evidence of thoracic spine fracture. Alignment is normal. No other significant bone abnormalities are identified. IMPRESSION: Negative. Electronically Signed   By: Lupita Raider M.D.   On: 10/30/2019 09:24    Procedures Procedures (including critical care time)  Medications Ordered in ED Medications  ziprasidone (GEODON) injection 10 mg (has no administration in time range)     Initial Impression / Assessment and Plan / ED Course  I have reviewed the triage vital signs and the nursing notes.  Pertinent labs & imaging results that were available during my care of the patient were reviewed by me and considered in my medical decision making (see chart for details).    CRITICAL CARE Performed by: Bethann Berkshire Total critical care time: 40 minutes Critical care time was exclusive of separately billable procedures and treating other patients. Critical care was necessary to treat or prevent imminent or life-threatening deterioration. Critical care was time spent personally by me on the following activities: development of treatment plan with patient and/or surrogate as well as nursing, discussions with consultants, evaluation of patient's response to treatment, examination of patient, obtaining history from patient or surrogate, ordering and performing treatments and interventions, ordering and review of laboratory studies, ordering and review of radiographic studies, pulse oximetry and  re-evaluation of patient's condition.      Patient with delusions.  She will be seen by behavioral health  Final Clinical Impressions(s) / ED Diagnoses   Final diagnoses:  None    ED Discharge Orders    None       Bethann Berkshire, MD 11/01/19 1004

## 2019-10-30 NOTE — ED Notes (Signed)
Per RN, patient medicated, sleep and unable to participate in assessment. TTS to attempt at later time.

## 2019-10-30 NOTE — ED Notes (Signed)
Patient transported to X-ray 

## 2019-10-30 NOTE — ED Notes (Signed)
Pt states "I feel like my labs are critical something is trying to poke my damn lungs." explained that she had not been called but was still receiving care and would be called as soon as a room was available to her.

## 2019-10-31 ENCOUNTER — Encounter (HOSPITAL_COMMUNITY): Payer: Self-pay | Admitting: Psychiatric/Mental Health

## 2019-10-31 ENCOUNTER — Inpatient Hospital Stay (HOSPITAL_COMMUNITY)
Admission: AD | Admit: 2019-10-31 | Discharge: 2019-11-05 | DRG: 885 | Disposition: A | Discharge status: Home / Self Care | Payer: Medicaid Other | Source: Intra-hospital | Attending: Psychiatry | Admitting: Psychiatry

## 2019-10-31 ENCOUNTER — Other Ambulatory Visit: Payer: Self-pay

## 2019-10-31 DIAGNOSIS — G4700 Insomnia, unspecified: Secondary | ICD-10-CM | POA: Diagnosis present

## 2019-10-31 DIAGNOSIS — F4310 Post-traumatic stress disorder, unspecified: Secondary | ICD-10-CM | POA: Diagnosis present

## 2019-10-31 DIAGNOSIS — Z590 Homelessness: Secondary | ICD-10-CM

## 2019-10-31 DIAGNOSIS — F22 Delusional disorders: Principal | ICD-10-CM | POA: Diagnosis present

## 2019-10-31 DIAGNOSIS — F17210 Nicotine dependence, cigarettes, uncomplicated: Secondary | ICD-10-CM | POA: Diagnosis present

## 2019-10-31 LAB — RAPID HIV SCREEN (HIV 1/2 AB+AG)
HIV 1/2 Antibodies: NONREACTIVE
HIV-1 P24 Antigen - HIV24: NONREACTIVE

## 2019-10-31 LAB — HEPATITIS B SURFACE ANTIGEN: Hepatitis B Surface Ag: NONREACTIVE

## 2019-10-31 LAB — SARS CORONAVIRUS 2 (TAT 6-24 HRS): SARS Coronavirus 2: NEGATIVE

## 2019-10-31 LAB — HEPATITIS PANEL, ACUTE
HCV Ab: NONREACTIVE
Hep A IgM: NONREACTIVE
Hep B C IgM: NONREACTIVE
Hepatitis B Surface Ag: NONREACTIVE

## 2019-10-31 MED ORDER — HYDROXYZINE HCL 25 MG PO TABS: 25.0000 mg | ORAL_TABLET | Freq: Three times a day (TID) | ORAL | Status: DC | PRN

## 2019-10-31 MED ORDER — TRAZODONE HCL 50 MG PO TABS: 50.0000 mg | ORAL_TABLET | Freq: Every evening | ORAL | Status: DC | PRN

## 2019-10-31 MED ORDER — MAGNESIUM HYDROXIDE 400 MG/5ML PO SUSP: 30.0000 mL | Freq: Every day | ORAL | Status: DC | PRN

## 2019-10-31 MED ORDER — ACETAMINOPHEN 500 MG PO TABS
1000.0000 mg | ORAL_TABLET | Freq: Four times a day (QID) | ORAL | Status: DC | PRN
  Administered 2019-10-31: 1000 mg via ORAL
  Filled 2019-10-31: qty 2

## 2019-10-31 MED ORDER — RISPERIDONE 1 MG PO TBDP
1.0000 mg | ORAL_TABLET | Freq: Two times a day (BID) | ORAL | Status: DC
  Filled 2019-10-31: qty 1

## 2019-10-31 MED ORDER — ALUM & MAG HYDROXIDE-SIMETH 200-200-20 MG/5ML PO SUSP: 30.0000 mL | ORAL | Status: DC | PRN

## 2019-10-31 NOTE — Progress Notes (Signed)
Patient ID: Diane Saunders, female   DOB: [birth year], 43 y.o.   MRN: [id number] Patient presents voluntarily reporting that she has been seeing bags coming out of her body. Frequently tapping her body, trying to chase the bugs.  Patient is disheveled with poor hygiene and body odor. Reports that she is homeless. Presents with grandiose behaviors, reporting that she has a "huge, beautiful house" but had to leave "because it was inhabitable". Reports that she is very intelligent, that she did not finish high school but was able to go to college "because I am smart". Denies history of abuse. Denies hallucinations. Denies thoughts of self harm. The two areas to work on are: finding a place to live at and getting herself better. Skin assessment performed by this writer, assisted by nurse Will: patient has multiple scars allover the body and reports that it is because of those bugs coming out of her system. Skin is flaky and unkept. Patient is admitted and oriented to the unit. Safety precautions initiated.

## 2019-10-31 NOTE — ED Notes (Signed)
Pt to room 41. Pt oriented to unit. Pt calm, cooperative, no s/s of distress. Pt denied SI/HI at this time. Pt stated, just want to be safe and stay well.

## 2019-10-31 NOTE — ED Notes (Signed)
0515 Attempted to complete assessment again, Per Sophia, RN, patient unable to complete assessment. TTS to attempt at later time.

## 2019-10-31 NOTE — BH Assessment (Signed)
[facility] Assessment Progress Note  Per Myles Lipps, MD, this pt is to be admitted to the [facility] Observation Unit.  Heather, RN has assigned pt to [facility] Rm 402-1.  Pt presents under IVC initiated by Verneda Skill, MD, and IVC documents have been sent to [facility]  Pt's nurse, Eustaquio Maize, has been notified, and agrees to call report to [phone].  Pt is to be transported via Event organiser.   Jalene Mullet, [place] Coordinator [phone]

## 2019-10-31 NOTE — Consult Note (Signed)
Telepsych Consultation   Reason for Consult:  Psychiatric evaluation Referring Physician:  [facility] Location of Patient: [facility]  Patient Identification: Diane Saunders MRN:  [id number] Principal Diagnosis: Delusional disorder [facility](HCC) Diagnosis:  Principal Problem:   Delusional disorder (HCC)   Total Time spent with patient: 45 minutes  Subjective:   "I'm fine, as soon as the bugs stop flying from my back my hair and my ass, i'll be better."  Diane Saunders is a 43 y.o. female patient admitted with and  seen via tele psych by this provider, Dr. Georga Hackingary, and chart reviewed on 10/31/19.  On evaluation Diane Saunders reports that she is feeling much better and that she would feel even better if "you doctors would patch up these holes the bugs are flying out of."  "I cant even go to the store without all these bugs going ping ping ping coming out of my head and back. Before you guys try to work on my psych stuff, yall need to fix this medical problem."    HPI: During evaluation Diane Saunders is fully engaged in the assessment interview.  She was forthcoming with the details she deems to be the problem. She is alert/oriented x 4; calm/cooperative; and mood is congruent with affect.  She does not appear to be responding to internal/external stimuli, however she is experiencing tactile/somatic delusional thoughts.  Patient denies suicidal/self-harm/homicidal ideation she is actively psychotic, and paranoid.  Patient answered question appropriately.     Past Psychiatric History: Bipolar disorder  Risk to Self:  No Risk to Others:  No Prior Inpatient Therapy:  Yes  Prior Outpatient Therapy:  Yes   Past Medical History:  Past Medical History:  Diagnosis Date  . Bipolar disorder (HCC)   . Depression   . Fatty liver   . Heart murmur   . PTSD (post-traumatic stress disorder)    History reviewed. No pertinent surgical history. Family History: No family  history on file. Family Psychiatric  History: Unknown Social History:  Social History   Substance and Sexual Activity  Alcohol Use Yes  . Alcohol/week: 2.0 standard drinks  . Types: 2 Cans of beer per week   Comment: Pt reports she drinks "light beer as a diuretic when her foot is swollen"     Social History   Substance and Sexual Activity  Drug Use Yes  . Types: Cocaine, Amphetamines, Marijuana   Comment: last use last month on 08/06/2019 assessment    Social History   Socioeconomic History  . Marital status: Divorced    Spouse name: Not on file  . Number of children: Not on file  . Years of education: Not on file  . Highest education level: Not on file  Occupational History  . Not on file  Tobacco Use  . Smoking status: Current Every Day Smoker    Packs/day: 0.50    Types: Cigarettes  . Smokeless tobacco: Never Used  Substance and Sexual Activity  . Alcohol use: Yes    Alcohol/week: 2.0 standard drinks    Types: 2 Cans of beer per week    Comment: Pt reports she drinks "light beer as a diuretic when her foot is swollen"  . Drug use: Yes    Types: Cocaine, Amphetamines, Marijuana    Comment: last use last month on 08/06/2019 assessment  . Sexual activity: Yes    Birth control/protection: Condom  Other Topics Concern  . Not on file  Social History Narrative  .  Not on file   Social Determinants of Health   Financial Resource Strain:   . Difficulty of Paying Living Expenses: Not on file  Food Insecurity:   . Worried About Charity fundraiser in the Last Year: Not on file  . Ran Out of Food in the Last Year: Not on file  Transportation Needs:   . Lack of Transportation (Medical): Not on file  . Lack of Transportation (Non-Medical): Not on file  Physical Activity:   . Days of Exercise per Week: Not on file  . Minutes of Exercise per Session: Not on file  Stress:   . Feeling of Stress : Not on file  Social Connections:   . Frequency of Communication with  Friends and Family: Not on file  . Frequency of Social Gatherings with Friends and Family: Not on file  . Attends Religious Services: Not on file  . Active Member of Clubs or Organizations: Not on file  . Attends Archivist Meetings: Not on file  . Marital Status: Not on file   Additional Social History:    Allergies:   Allergies  Allergen Reactions  . Mushroom Extract Complex Itching    Labs:  Results for orders placed or performed during the hospital encounter of 10/30/19 (from the past 48 hour(s))  SARS CORONAVIRUS 2 (TAT 6-24 HRS) Nasopharyngeal Nasopharyngeal Swab     Status: None   Collection Time: 10/30/19  7:44 PM   Specimen: Nasopharyngeal Swab  Result Value Ref Range   SARS Coronavirus 2 NEGATIVE NEGATIVE    Comment: (NOTE) SARS-CoV-2 target nucleic acids are NOT DETECTED. The SARS-CoV-2 RNA is generally detectable in upper and lower respiratory specimens during the acute phase of infection. Negative results do not preclude SARS-CoV-2 infection, do not rule out co-infections with other pathogens, and should not be used as the sole basis for treatment or other patient management decisions. Negative results must be combined with clinical observations, patient history, and epidemiological information. The expected result is Negative. Fact Sheet for Patients: SugarRoll.be Fact Sheet for Healthcare Providers: https://www.woods-mathews.com/ This test is not yet approved or cleared by the [place] FDA and  has been authorized for detection and/or diagnosis of SARS-CoV-2 by FDA under an Emergency Use Authorization (EUA). This EUA will remain  in effect (meaning this test can be used) for the duration of the COVID-19 declaration under Section 56 4(b)(1) of the Act, 21 U.S.C. section 360bbb-3(b)(1), unless the authorization is terminated or revoked sooner. Performed at [facility], [place] [address],  [place], [place] [address]   CBC with Differential     Status: Abnormal   Collection Time: 10/30/19  9:49 PM  Result Value Ref Range   WBC 7.0 4.0 - 10.5 K/uL   RBC 4.08 3.87 - 5.11 MIL/uL   Hemoglobin 11.0 (L) 12.0 - 15.0 g/dL   HCT 35.5 (L) 36.0 - 46.0 %   MCV 87.0 80.0 - 100.0 fL   MCH 27.0 26.0 - 34.0 pg   MCHC 31.0 30.0 - 36.0 g/dL   RDW 13.9 11.5 - 15.5 %   Platelets 279 150 - 400 K/uL   nRBC 0.0 0.0 - 0.2 %   Neutrophils Relative % 38 %   Neutro Abs 2.6 1.7 - 7.7 K/uL   Lymphocytes Relative 54 %   Lymphs Abs 3.8 0.7 - 4.0 K/uL   Monocytes Relative 6 %   Monocytes Absolute 0.4 0.1 - 1.0 K/uL   Eosinophils Relative 2 %  Eosinophils Absolute 0.1 0.0 - 0.5 K/uL   Basophils Relative 0 %   Basophils Absolute 0.0 0.0 - 0.1 K/uL   Immature Granulocytes 0 %   Abs Immature Granulocytes 0.02 0.00 - 0.07 K/uL    Comment: Performed at [facility], [address], [place], [place] [address]  Comprehensive metabolic panel     Status: Abnormal   Collection Time: 10/30/19  9:49 PM  Result Value Ref Range   Sodium 140 135 - 145 mmol/L   Potassium 3.7 3.5 - 5.1 mmol/L   Chloride 107 98 - 111 mmol/L   CO2 23 22 - 32 mmol/L   Glucose, Bld 141 (H) 70 - 99 mg/dL   BUN 12 6 - 20 mg/dL   Creatinine, Ser 1.91 0.44 - 1.00 mg/dL   Calcium 8.9 8.9 - 47.8 mg/dL   Total Protein 6.5 6.5 - 8.1 g/dL   Albumin 3.3 (L) 3.5 - 5.0 g/dL   AST 18 15 - 41 U/L   ALT 18 0 - 44 U/L   Alkaline Phosphatase 45 38 - 126 U/L   Total Bilirubin 0.2 (L) 0.3 - 1.2 mg/dL   GFR calc non Af Amer >60 >60 mL/min   GFR calc Af Amer >60 >60 mL/min   Anion gap 10 5 - 15    Comment: Performed at [facility], [address] [place], [place] [address]  Ethanol     Status: None   Collection Time: 10/30/19  9:49 PM  Result Value Ref Range   Alcohol, Ethyl (B) <10 <10 mg/dL    Comment: (NOTE) Lowest detectable limit for serum alcohol is 10 mg/dL. For medical purposes only. Performed  at [facility], [address] [place], [place] [address]   Hepatitis B surface antigen     Status: None   Collection Time: 10/30/19 11:15 PM  Result Value Ref Range   Hepatitis B Surface Ag NON REACTIVE NON REACTIVE    Comment: Performed at [facility], [address] [place], [place] [address]  Rapid HIV screen (HIV 1/2 Ab+Ag)     Status: None   Collection Time: 10/30/19 11:16 PM  Result Value Ref Range   HIV-1 P24 Antigen - HIV24 NON REACTIVE NON REACTIVE    Comment: (NOTE) Detection of p24 may be inhibited by biotin in the sample, causing false negative results in acute infection.    HIV 1/2 Antibodies NON REACTIVE NON REACTIVE   Interpretation (HIV Ag Ab)      A non reactive test result means that HIV 1 or HIV 2 antibodies and HIV 1 p24 antigen were not detected in the specimen.    Comment: RESULT CALLED TO, READ BACK BY AND VERIFIED WITH: BROOKS,B @ 0003 ON [id number] BY POTEAT,S Performed at [facility], [address], [place], [place] [address]   Hepatitis panel, acute     Status: None   Collection Time: 10/30/19 11:16 PM  Result Value Ref Range   Hepatitis B Surface Ag NON REACTIVE NON REACTIVE   HCV Ab NON REACTIVE NON REACTIVE    Comment: (NOTE) Nonreactive HCV antibody screen is consistent with no HCV infections,  unless recent infection is suspected or other evidence exists to indicate HCV infection.    Hep A IgM NON REACTIVE NON REACTIVE   Hep B C IgM NON REACTIVE NON REACTIVE    Comment: Performed at [facility], [address], [place], [place] [address]  Medications:  Current Facility-Administered Medications  Medication Dose Route Frequency Provider Last Rate Last Admin  . acetaminophen (TYLENOL) tablet 1,000 mg  1,000 mg Oral Q6H PRN Melene Plan, DO   1,000 mg at 10/31/19 0962  . risperiDONE (RISPERDAL M-TABS) disintegrating tablet 1 mg  1 mg Oral BID Antonieta Pert, MD       No current outpatient  medications on file.    Musculoskeletal: Strength & Muscle Tone: within normal limits Gait & Station: normal Patient leans: N/A  Psychiatric Specialty Exam: Physical Exam  Nursing note and vitals reviewed. Constitutional: She appears well-developed.  Eyes: Pupils are equal, round, and reactive to light.  Respiratory: Effort normal.  Musculoskeletal:        General: Normal range of motion.     Cervical back: Normal range of motion.  Neurological: She is alert.  Skin: Skin is warm and dry.  Psychiatric: Her mood appears anxious. Her affect is labile and inappropriate. She is actively hallucinating. Thought content is paranoid and delusional. Cognition and memory are normal. She expresses impulsivity.    Review of Systems  Psychiatric/Behavioral: Positive for hallucinations. The patient is nervous/anxious.   All other systems reviewed and are negative.   Blood pressure (!) 136/48, pulse (!) 50, temperature 98.4 F (36.9 C), temperature source Oral, resp. rate 18, height 5\' 6"  (1.676 m), weight 81.6 kg, last menstrual period 10/11/2019, SpO2 97 %.Body mass index is 29.05 kg/m.  General Appearance: Casual  Eye Contact:  Good  Speech:  Normal Rate  Volume:  Increased  Mood:  Anxious  Affect:  Congruent  Thought Process:  Coherent and Descriptions of Associations: Intact  Orientation:  Full (Time, Place, and Person)  Thought Content:  Illogical, Delusions and Hallucinations: Tactile  Suicidal Thoughts:  No  Homicidal Thoughts:  No  Memory:  Immediate;   Good  Judgement:  Impaired  Insight:  Lacking  Psychomotor Activity:  Normal  Concentration:  Concentration: Fair  Recall:  Good  Fund of Knowledge:  Good  Language:  Good  Akathisia:  NA  Handed:  Right  AIMS (if indicated):     Assets:  Communication Skills Desire for Improvement  ADL's:  Intact  Cognition:  WNL  Sleep:        Treatment Plan Summary: Medication management, Plan Inpatient Psych admission and  psychiatric stabilization  Disposition: Recommend psychiatric Inpatient admission when medically cleared. Supportive therapy provided about ongoing stressors.  This service was provided via telemedicine using a 2-way, interactive audio and video technology.  Names of all persons participating in this telemedicine service and their role in this encounter. Name: Dr. 10/13/2019 Psychiatrist  Name: Jola Babinski PMHNP-BC Role: Nurse Practioner          Lerry Liner, NP 10/31/2019 5:17 PM

## 2019-10-31 NOTE — ED Notes (Signed)
Pt DCd off unit to [facility] OBS unit per provider. Pt calm, cooperative, no s/s of distress.DC information given to [employer] for facility . Belongings given to [employer] for facility . Pt ambulatory off unit, escorted and transported by [employer].

## 2019-11-01 DIAGNOSIS — F4310 Post-traumatic stress disorder, unspecified: Secondary | ICD-10-CM | POA: Diagnosis present

## 2019-11-01 DIAGNOSIS — F22 Delusional disorders: Secondary | ICD-10-CM | POA: Diagnosis present

## 2019-11-01 DIAGNOSIS — G4700 Insomnia, unspecified: Secondary | ICD-10-CM | POA: Diagnosis present

## 2019-11-01 DIAGNOSIS — F17210 Nicotine dependence, cigarettes, uncomplicated: Secondary | ICD-10-CM | POA: Diagnosis present

## 2019-11-01 DIAGNOSIS — Z590 Homelessness: Secondary | ICD-10-CM | POA: Diagnosis not present

## 2019-11-01 MED ORDER — BENZTROPINE MESYLATE 0.5 MG PO TABS
0.5000 mg | ORAL_TABLET | Freq: Two times a day (BID) | ORAL | Status: DC
  Administered 2019-11-01 – 2019-11-05 (×8): 0.5 mg via ORAL
  Filled 2019-11-01 (×14): qty 1

## 2019-11-01 MED ORDER — HYDROXYZINE HCL 50 MG PO TABS
50.0000 mg | ORAL_TABLET | Freq: Three times a day (TID) | ORAL | Status: DC
  Administered 2019-11-01 – 2019-11-04 (×9): 50 mg via ORAL
  Filled 2019-11-01 (×17): qty 1

## 2019-11-01 MED ORDER — GABAPENTIN 300 MG PO CAPS
300.0000 mg | ORAL_CAPSULE | Freq: Three times a day (TID) | ORAL | Status: DC
  Administered 2019-11-01 – 2019-11-05 (×12): 300 mg via ORAL
  Filled 2019-11-01 (×20): qty 1

## 2019-11-01 MED ORDER — TEMAZEPAM 15 MG PO CAPS
30.0000 mg | ORAL_CAPSULE | Freq: Every day | ORAL | Status: DC
  Administered 2019-11-03: 21:00:00 30 mg via ORAL
  Filled 2019-11-01: qty 2
  Filled 2019-11-01 (×2): qty 1

## 2019-11-01 MED ORDER — RISPERIDONE 2 MG PO TABS
2.0000 mg | ORAL_TABLET | Freq: Two times a day (BID) | ORAL | Status: DC
  Administered 2019-11-01 – 2019-11-05 (×8): 2 mg via ORAL
  Filled 2019-11-01 (×14): qty 1

## 2019-11-01 NOTE — Tx Team (Signed)
Initial Treatment Plan 11/01/2019 5:40 PM Diane Saunders IRW:[id number]    PATIENT STRESSORS: Financial difficulties Health problems   PATIENT STRENGTHS: Ability for insight Communication skills Motivation for treatment/growth   PATIENT IDENTIFIED PROBLEMS: Paranoia  Hallucination                   DISCHARGE CRITERIA:  Ability to meet basic life and health needs Adequate post-discharge living arrangements Motivation to continue treatment in a less acute level of care  PRELIMINARY DISCHARGE PLAN: Attend aftercare/continuing care group Outpatient therapy Return to previous living arrangement  PATIENT/FAMILY INVOLVEMENT: This treatment plan has been presented to and reviewed with the patient, Diane Saunders, and/or family member.  The patient and family have been given the opportunity to ask questions and make suggestions.  Vela Prose, RN 11/01/2019, 5:40 PM

## 2019-11-01 NOTE — BH Assessment (Signed)
[place] Assessment Progress Note  Per Letitia Libra, FNP, this pt requires psychiatric hospitalization.  Otila Kluver reports that pt has been assigned pt to [facility] Rm 503-2.  Pt presents under IVC initiated by EDP Milton Ferguson, MD.  Pt's nurse, [place].   Jalene Mullet, [place] Coordinator [phone]

## 2019-11-01 NOTE — Progress Notes (Signed)
Pt A & O to self, place and time. Speech is clear, loud and pressured. Denies SI, HI, AVH and pain at this time. Pt presents delusional, somatic in nature.Per pt "I'm fine, I know I have Bipolar but I know there are medical issues going on with me that you professionals are not helping me. I have bugs and stuff that looks like lint coming out my skin and I need help". Pt's skin was assessed multiple scabs noted on left buttock and upper back "that's where all these bugs are coming from and it hurts". Pt reports fair sleep last night "because I have to continue sleeping with a mask just so these bugs with not climb into my mouth". Emotional support offered to pt. Safety checks maintained without self harm gestures or outburst to note thus far.

## 2019-11-01 NOTE — Progress Notes (Signed)
Reform NOVEL CORONAVIRUS (COVID-19) DAILY CHECK-OFF SYMPTOMS - answer yes or no to each - every day NO YES  Have you had a fever in the past 24 hours?  . Fever (Temp > 37.80C / 100F) X   Have you had any of these symptoms in the past 24 hours? . New Cough .  Sore Throat  .  Shortness of Breath .  Difficulty Breathing .  Unexplained Body Aches   X   Have you had any one of these symptoms in the past 24 hours not related to allergies?   . Runny Nose .  Nasal Congestion .  Sneezing   X   If you have had runny nose, nasal congestion, sneezing in the past 24 hours, has it worsened?  X   EXPOSURES - check yes or no X   Have you traveled outside the state in the past 14 days?  X   Have you been in contact with someone with a confirmed diagnosis of COVID-19 or PUI in the past 14 days without wearing appropriate PPE?  X   Have you been living in the same home as a person with confirmed diagnosis of COVID-19 or a PUI (household contact)?    X   Have you been diagnosed with COVID-19?    X              What to do next: Answered NO to all: Answered YES to anything:   Proceed with unit schedule Follow the [facility] Inpatient Flowsheet.   

## 2019-11-01 NOTE — Progress Notes (Signed)
   11/01/19 2100  Psych Admission Type (Psych Patients Only)  Admission Status Involuntary  Psychosocial Assessment  Patient Complaints Anxiety  Eye Contact Fair  Facial Expression Anxious  Affect Appropriate to circumstance  Speech Pressured  Interaction Hypervigilant;Defensive;Needy  Motor Activity Other (Comment) (WNL)  Appearance/Hygiene Layered clothes;Disheveled  Behavior Characteristics Cooperative  Mood Anxious  Thought Process  Coherency Circumstantial  Content Blaming self;Blaming others  Delusions Paranoid;Grandeur;Somatic  Perception WDL  Hallucination None reported or observed  Judgment Poor  Confusion None  Danger to Self  Current suicidal ideation? Denies  Danger to Others  Danger to Others None reported or observed   Pt lethargic this evening, minimal interaction.

## 2019-11-01 NOTE — Progress Notes (Signed)
Admission Note: Patient transferred from the observation unit with symptoms of paranoia and hallucination.  Reports seeing bugs coming out of her body and demanding treatment for it.  Presents with a blunted affect and mood.  Patient oriented to the unit, staff and room.  Routine safety checks initiated.  Verbalizes understanding of unit rules and protocols.  Support and encouragement offered as needed.  Patient is safe on the unit.

## 2019-11-02 DIAGNOSIS — F22 Delusional disorders: Principal | ICD-10-CM

## 2019-11-02 NOTE — [facility] Group Notes (Signed)
[employer] Group Notes:  (Nursing/MHT/Case Management/Adjunct)  Date:  11/02/2019  Time:  0900  Type of Therapy:  Nurse Education  Art Painting Group   Participation Level:  Did Not Attend  Marissa Calamity 11/02/2019, 10:34 AM

## 2019-11-02 NOTE — [facility] Suicide Risk Assessment (Signed)
[place] INPATIENT:  Family/Significant Other Suicide Prevention Education  Suicide Prevention Education:  Patient Refusal for Family/Significant Other Suicide Prevention Education: The patient Diane Saunders has refused to provide written consent for family/significant other to be provided Family/Significant Other Suicide Prevention Education during admission and/or prior to discharge.  Physician notified.  Suicide Prevention Education was reviewed thoroughly with patient, including risk factors, warning signs, and what to do.  Mobile Crisis services were described and that telephone number pointed out, with encouragement to patient to put this number in personal cell phone.  Brochure was provided to patient to share with natural supports.  Patient acknowledged the ways in which they are at risk, and how working through each of their issues can gradually start to reduce their risk factors.  Patient was encouraged to think of the information in the context of people in their own lives.  Patient denied having access to firearms  Patient verbalized understanding of information provided.  Patient endorsed a desire to live.      Berlin Hun Grossman-Orr 11/02/2019, 3:12 PM
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
## 2019-11-02 NOTE — Plan of Care (Signed)
  Problem: Coping: Goal: Ability to verbalize frustrations and anger appropriately will improve Outcome: Progressing   Problem: Safety: Goal: Periods of time without injury will increase Outcome: Progressing   Problem: Self-Care: Goal: Ability to participate in self-care as condition permits will improve Outcome: Progressing

## 2019-11-02 NOTE — Progress Notes (Signed)
Patient ID: Diane Saunders, female   DOB: 07-24-1976, 43 y.o.   MRN: [id number]  [place] NOVEL CORONAVIRUS (COVID-19) DAILY CHECK-OFF SYMPTOMS - answer yes or no to each - every day NO YES  Have you had a fever in the past 24 hours?  . Fever (Temp > 37.80C / 100F) X   Have you had any of these symptoms in the past 24 hours? . New Cough .  Sore Throat  .  Shortness of Breath .  Difficulty Breathing .  Unexplained Body Aches   X   Have you had any one of these symptoms in the past 24 hours not related to allergies?   . Runny Nose .  Nasal Congestion .  Sneezing   X   If you have had runny nose, nasal congestion, sneezing in the past 24 hours, has it worsened?  X   EXPOSURES - check yes or no X   Have you traveled outside the state in the past 14 days?  X   Have you been in contact with someone with a confirmed diagnosis of COVID-19 or PUI in the past 14 days without wearing appropriate PPE?  X   Have you been living in the same home as a person with confirmed diagnosis of COVID-19 or a PUI (household contact)?    X   Have you been diagnosed with COVID-19?    X              What to do next: Answered NO to all: Answered YES to anything:   Proceed with unit schedule Follow the [facility] Inpatient Flowsheet.

## 2019-11-03 LAB — LIPID PANEL
Cholesterol: 206 mg/dL — ABNORMAL HIGH (ref 0–200)
HDL: 55 mg/dL (ref >40)
LDL Cholesterol: 130 mg/dL — ABNORMAL HIGH (ref 0–99)
Total CHOL/HDL Ratio: 3.7 RATIO
Triglycerides: 106 mg/dL (ref <150)
VLDL: 21 mg/dL (ref 0–40)

## 2019-11-03 LAB — HEMOGLOBIN A1C
Hgb A1c MFr Bld: 5.6 % (ref 4.8–5.6)
Mean Plasma Glucose: 114.02 mg/dL

## 2019-11-03 LAB — TSH: TSH: 0.929 u[IU]/mL (ref 0.350–4.500)

## 2019-11-03 MED ORDER — IBUPROFEN 800 MG PO TABS
ORAL_TABLET | ORAL | Status: AC
  Filled 2019-11-03: qty 1

## 2019-11-03 MED ORDER — IBUPROFEN 800 MG PO TABS
800.0000 mg | ORAL_TABLET | Freq: Three times a day (TID) | ORAL | Status: DC | PRN
  Administered 2019-11-03 – 2019-11-05 (×4): 800 mg via ORAL
  Filled 2019-11-03 (×3): qty 1

## 2019-11-03 MED ORDER — AMOXICILLIN-POT CLAVULANATE 875-125 MG PO TABS
1.0000 | ORAL_TABLET | Freq: Two times a day (BID) | ORAL | Status: DC
  Administered 2019-11-03 – 2019-11-05 (×4): 1 via ORAL
  Filled 2019-11-03 (×8): qty 1

## 2019-11-03 MED ORDER — BENZOCAINE 10 % MT GEL
Freq: Four times a day (QID) | OROMUCOSAL | Status: DC | PRN
  Administered 2019-11-04: 1 via OROMUCOSAL
  Filled 2019-11-03: qty 9

## 2019-11-03 NOTE — Progress Notes (Signed)
   11/02/19 2235  Psych Admission Type (Psych Patients Only)  Admission Status Involuntary  Psychosocial Assessment  Patient Complaints None  Eye Contact Fair  Facial Expression Flat  Affect Appropriate to circumstance  Speech Logical/coherent  Interaction Minimal;No initiation  Motor Activity Other (Comment) (WNL)  Appearance/Hygiene Unremarkable  Behavior Characteristics Appropriate to situation  Mood Preoccupied  Thought Process  Coherency Circumstantial  Content Blaming others;Blaming self  Delusions Grandeur;Paranoid;Somatic  Perception WDL  Hallucination None reported or observed  Judgment Poor  Confusion None  Danger to Self  Current suicidal ideation? Denies  Danger to Others  Danger to Others None reported or observed

## 2019-11-03 NOTE — Progress Notes (Signed)
[facility] Notes: (Nursing/MHT/Case Management/Adjunct)  Date:11/03/2019 Time:900 am  Type of Therapy:Nurse EducationVision Boards for 2021  Participation Level:Active  Participation Quality:Appropriate  Affect:Appropriate  Cognitive:Disorganized   Insight:Appropriate  Engagement in Group:Engaged  Modes of Intervention:Activity  Summary of Progress/Problems:  Diane Saunders L
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
## 2019-11-03 NOTE — Progress Notes (Signed)
[place] NOVEL CORONAVIRUS (COVID-19) DAILY CHECK-OFF SYMPTOMS - answer yes or no to each - every day NO YES  Have you had a fever in the past 24 hours?  . Fever (Temp > 37.80C / 100F) X   Have you had any of these symptoms in the past 24 hours? . New Cough .  Sore Throat  .  Shortness of Breath .  Difficulty Breathing .  Unexplained Body Aches   X   Have you had any one of these symptoms in the past 24 hours not related to allergies?   . Runny Nose .  Nasal Congestion .  Sneezing   X   If you have had runny nose, nasal congestion, sneezing in the past 24 hours, has it worsened?  X   EXPOSURES - check yes or no X   Have you traveled outside the state in the past 14 days?  X   Have you been in contact with someone with a confirmed diagnosis of COVID-19 or PUI in the past 14 days without wearing appropriate PPE?  X   Have you been living in the same home as a person with confirmed diagnosis of COVID-19 or a PUI (household contact)?    X   Have you been diagnosed with COVID-19?    X              What to do next: Answered NO to all: Answered YES to anything:   Proceed with unit schedule Follow the [facility] Inpatient Flowsheet.   

## 2019-11-03 NOTE — Progress Notes (Signed)
Patient did not attend wrap up group. 
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
## 2019-11-04 MED ORDER — TEMAZEPAM 15 MG PO CAPS
15.0000 mg | ORAL_CAPSULE | Freq: Every day | ORAL | Status: DC
  Administered 2019-11-04: 15 mg via ORAL
  Filled 2019-11-04: qty 1

## 2019-11-04 MED ORDER — HYDROXYZINE HCL 25 MG PO TABS
25.0000 mg | ORAL_TABLET | Freq: Three times a day (TID) | ORAL | Status: DC
  Administered 2019-11-04 – 2019-11-05 (×3): 25 mg via ORAL
  Filled 2019-11-04 (×9): qty 1

## 2019-11-04 NOTE — Progress Notes (Signed)
   11/04/19 2324  Psych Admission Type (Psych Patients Only)  Admission Status Involuntary  Psychosocial Assessment  Patient Complaints Anxiety  Eye Contact Fair  Facial Expression Anxious  Affect Anxious;Appropriate to circumstance  Speech Logical/coherent  Interaction Assertive;Needy  Motor Activity Restless  Appearance/Hygiene Unremarkable  Behavior Characteristics Cooperative  Mood Preoccupied  Thought Process  Coherency Circumstantial  Content Obsessions  Delusions None reported or observed  Perception WDL  Hallucination None reported or observed  Judgment Poor  Confusion None  Danger to Self  Current suicidal ideation? Denies  Danger to Others  Danger to Others None reported or observed  D: Patient pacing at nurses station on approach. Pt reports anxiety for dark spots on her skin and swollen left face.  A: Medications administered as prescribed. Support and encouragement provided as needed.  R: Patient remains safe on the unit. Will continue to monitor for safety and stability.

## 2019-11-04 NOTE — Progress Notes (Signed)
Spiritual care group on grief and loss facilitated by chaplain Dyani Babel MDiv, [facility]  Group Goal:  Support / Education around grief and loss Members engage in facilitated group support and psycho-social education.  Group Description:  Following introductions and group rules, group members engaged in facilitated group dialog and support around topic of loss, with particular support around experiences of loss in their lives. Group Identified types of loss (relationships / self / things) and identified patterns, circumstances, and changes that precipitate losses. Reflected on thoughts / feelings around loss, normalized grief responses, and recognized variety in grief experience.   Group noted Worden's four tasks of grief in discussion.  Group drew on Adlerian / Rogerian, narrative, MI, Patient Progress:   Did not attend  

## 2019-11-04 NOTE — Progress Notes (Signed)
Recreation Therapy Notes  Date:  12.14.20 Time: 0930 Location: [address] Dayroom  Group Topic: Stress Management  Goal Area(s) Addresses:  Patient will identify positive stress management techniques. Patient will identify benefits of using stress management post d/c.  Behavioral Response:  Engaged  Intervention: Stress Management  Activity : Meditation.  LRT played Saunders meditation that focused on being resilient in the face of adversity.  Patients were to listen and follow along as meditation played to fully engage.   Education:  Stress Management, Discharge Planning.   Education Outcome: Acknowledges Education  Clinical Observations/Feedback: Pt attended and participated in activity.    Diane Saunders, LRT/CTRS         Diane Saunders 11/04/2019 12:33 PM 

## 2019-11-04 NOTE — Progress Notes (Signed)
Adult Psychoeducational Group Note  Date:  11/04/2019 Time:  9:35 AM  Group Topic/Focus:  Wellness Toolbox:   The focus of this group is to discuss various aspects of wellness, balancing those aspects and exploring ways to increase the ability to experience wellness.  Patients will create a wellness toolbox for use upon discharge.  Participation Level:  Did Not Attend  Pt was informed that group with the MHT will begin. Pt chose not to attend group.   Lita Mains 11/04/2019, 9:35 AM
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
## 2019-11-04 NOTE — [facility] Group Notes (Signed)
Type of Therapy and Topic: Group Therapy: Core Beliefs  Participation Level: Did Not Attend  Description of Group: In this group patients will be encouraged to explore their negative and positive core beliefs about themselves, others, and the world. Each patient will be challenged to identify these beliefs and ways to challenge negative core beliefs. This group will be process-oriented, with patients participating in exploration of their own experiences as well as giving and receiving support and challenge from other group members.  Therapeutic Goals: 1. Patient will identify personal core beliefs, both negative and positive. 2. Patient will identify core beliefs relating to others, both negative and positive. 3. Patient will challenge their negative beliefs about themselves and others. 4. Patient will identify three changes they can make to replace negative core beliefs with positive beliefs.  Summary of Patient Progress  Due to the COVID-19 pandemic, this group has been supplemented with worksheets. Patient did not attend or participate/complete worksheet during group session.   Radonna Ricker, MSW, LCSW Clinical Social Worker [facility]  Phone: [phone]

## 2019-11-04 NOTE — Plan of Care (Signed)
Progress note  D: pt found in bed; compliant with medication administration. Pt is still having complaints about upper L jaw pain. Pt is persistent with wanting to go to their locker again. Pt provided education. Pt is asking for whitening(lightening?) cream because of inconsistencies in skin color. Pt states they have this in their locker. Pt was provided medication for their jaw pain. Pt has been visible on the unit and appropriately interacting in the milieu. Pt denies si/hi/ah/vh and verbally agrees to approach staff if these become apparent or before harming themself/others while at [place].  A: Pt provided support and encouragement. Pt given medication per protocol and standing orders. Q67m safety checks implemented and continued.  R: Pt safe on the unit. Will continue to monitor.  Pt progressing in the following metrics  Problem: Education: Goal: Verbalization of understanding the information provided will improve Outcome: Progressing   Problem: Activity: Goal: Interest or engagement in activities will improve Outcome: Progressing Goal: Sleeping patterns will improve Outcome: Progressing

## 2019-11-04 NOTE — Progress Notes (Signed)
The patient expressed in group that she was pleased with the results of her blood work today. In addition, she was optimistic about her housing issues as she has found a shelter to stay following discharge. Her goal for tomorrow is to "stay patient".

## 2019-11-04 NOTE — Tx Team (Signed)
Interdisciplinary Treatment and Diagnostic Plan Update  11/04/2019 Time of Session: 9:30am Diane Saunders MRN: [id number]  Principal Diagnosis: Delusional disorder [facility])  Secondary Diagnoses: Principal Problem:   Delusional disorder (HCC) Active Problems:   Delusional disorder, somatic type (H[facility])   Current Medications:  Current Facility-Administered Medications  Medication Dose Route Frequency Provider Last Rate Last Admin  . alum & mag hydroxide-simeth (MAALOX/MYLANTA) 200-200-20 MG/5ML suspension 30 mL  30 mL Oral Q4H PRN Dixon, Elray Buba, NP      . amoxicillin-clavulanate (AUGMENTIN) 875-125 MG per tablet 1 tablet  1 tablet Oral Q12H Nira Conn A, NP   1 tablet at 11/04/19 0758  . benzocaine (ORAJEL) 10 % mucosal gel   Mouth/Throat QID PRN Jackelyn Poling, NP   1 application at 11/04/19 1312  . benztropine (COGENTIN) tablet 0.5 mg  0.5 mg Oral BID Malvin Johns, MD   0.5 mg at 11/04/19 0758  . gabapentin (NEURONTIN) capsule 300 mg  300 mg Oral TID Malvin Johns, MD   300 mg at 11/04/19 1139  . hydrOXYzine (ATARAX/VISTARIL) tablet 25 mg  25 mg Oral TID Cobos, Rockey Situ, MD      . ibuprofen (ADVIL) tablet 800 mg  800 mg Oral TID PRN Nira Conn A, NP   800 mg at 11/04/19 1313  . magnesium hydroxide (MILK OF MAGNESIA) suspension 30 mL  30 mL Oral Daily PRN Jearld Lesch, NP      . risperiDONE (RISPERDAL) tablet 2 mg  2 mg Oral BID Malvin Johns, MD   2 mg at 11/04/19 0758  . temazepam (RESTORIL) capsule 15 mg  15 mg Oral QHS Cobos, Rockey Situ, MD       PTA Medications: No medications prior to admission.    Patient Stressors: Financial difficulties Health problems  Patient Strengths: Ability for Warden/ranger for treatment/growth  Treatment Modalities: Medication Management, Group therapy, Case management,  1 to 1 session with clinician, Psychoeducation, Recreational therapy.   Physician Treatment Plan for Primary Diagnosis: Delusional disorder  [facility]) Long Term Goal(s): Improvement in symptoms so as ready for discharge Improvement in symptoms so as ready for discharge   Short Term Goals: Ability to identify changes in lifestyle to reduce recurrence of condition will improve Ability to verbalize feelings will improve Ability to disclose and discuss suicidal ideas Ability to demonstrate self-control will improve Ability to identify and develop effective coping behaviors will improve  Medication Management: Evaluate patient's response, side effects, and tolerance of medication regimen.  Therapeutic Interventions: 1 to 1 sessions, Unit Group sessions and Medication administration.  Evaluation of Outcomes: Progressing  Physician Treatment Plan for Secondary Diagnosis: Principal Problem:   Delusional disorder (HCC) Active Problems:   Delusional disorder, somatic type (HCC)  Long Term Goal(s): Improvement in symptoms so as ready for discharge Improvement in symptoms so as ready for discharge   Short Term Goals: Ability to identify changes in lifestyle to reduce recurrence of condition will improve Ability to verbalize feelings will improve Ability to disclose and discuss suicidal ideas Ability to demonstrate self-control will improve Ability to identify and develop effective coping behaviors will improve     Medication Management: Evaluate patient's response, side effects, and tolerance of medication regimen.  Therapeutic Interventions: 1 to 1 sessions, Unit Group sessions and Medication administration.  Evaluation of Outcomes: Progressing   RN Treatment Plan for Primary Diagnosis: Delusional disorder (HCC) Long Term Goal(s): Knowledge of disease and therapeutic regimen to maintain health will improve  Short Term Goals:  Ability to participate in decision making will improve, Ability to identify and develop effective coping behaviors will improve and Compliance with prescribed medications will improve  Medication  Management: RN will administer medications as ordered by provider, will assess and evaluate patient's response and provide education to patient for prescribed medication. RN will report any adverse and/or side effects to prescribing provider.  Therapeutic Interventions: 1 on 1 counseling sessions, Psychoeducation, Medication administration, Evaluate responses to treatment, Monitor vital signs and CBGs as ordered, Perform/monitor CIWA, COWS, AIMS and Fall Risk screenings as ordered, Perform wound care treatments as ordered.  Evaluation of Outcomes: Progressing   LCSW Treatment Plan for Primary Diagnosis: Delusional disorder [facility]) Long Term Goal(s): Safe transition to appropriate next level of care at discharge, Engage patient in therapeutic group addressing interpersonal concerns.  Short Term Goals: Engage patient in aftercare planning with referrals and resources  Therapeutic Interventions: Assess for all discharge needs, 1 to 1 time with Social worker, Explore available resources and support systems, Assess for adequacy in community support network, Educate family and significant other(s) on suicide prevention, Complete Psychosocial Assessment, Interpersonal group therapy.  Evaluation of Outcomes: Adequate for Discharge   Progress in Treatment: Attending groups: No. Participating in groups: No. Taking medication as prescribed: Yes. Toleration medication: Yes. Family/Significant other contact made: No, will contact:  no one, patient declined consent Patient understands diagnosis: Yes. Discussing patient identified problems/goals with staff: Yes. Medical problems stabilized or resolved: Yes. Denies suicidal/homicidal ideation: Yes. Issues/concerns per patient self-inventory: No. Other:   New problem(s) identified: None   New Short Term/Long Term Goal(s): Detox, medication stabilization, elimination of SI thoughts, development of comprehensive mental wellness plan.    Patient Goals:     Discharge Plan or Barriers: Patient is currently homeless. CSW has provided housing/shelter resources. Patient will be referred to [facility] for outpatient medication management and therapy services.   Reason for Continuation of Hospitalization: Anxiety Medication stabilization  Estimated Length of Stay: 3-5 days   Attendees: Patient: Diane Saunders  11/04/2019 3:20 PM  Physician: Dr. Neita Garnet, MD 11/04/2019 3:20 PM  Nursing: Legrand Como.Chauncey Cruel, RN 11/04/2019 3:20 PM  RN Care Manager: 11/04/2019 3:20 PM  Social Worker: Radonna Ricker, LCSW 11/04/2019 3:20 PM  Recreational Therapist:  11/04/2019 3:20 PM  Other: Harriett Sine, NP  11/04/2019 3:20 PM  Other:  11/04/2019 3:20 PM  Other: 11/04/2019 3:20 PM    Scribe for Treatment Team: Marylee Floras, [place] 11/04/2019 3:20 PM

## 2019-11-05 MED ORDER — BENZOCAINE 10 % MT GEL: Freq: Four times a day (QID) | OROMUCOSAL | 0 refills | Status: DC | PRN

## 2019-11-05 MED ORDER — HYDROXYZINE HCL 25 MG PO TABS: 25.0000 mg | ORAL_TABLET | Freq: Three times a day (TID) | ORAL | Status: DC | PRN

## 2019-11-05 MED ORDER — BENZTROPINE MESYLATE 0.5 MG PO TABS: 0.5000 mg | ORAL_TABLET | Freq: Two times a day (BID) | ORAL | 0 refills | Status: DC

## 2019-11-05 MED ORDER — GABAPENTIN 300 MG PO CAPS: 300.0000 mg | ORAL_CAPSULE | Freq: Three times a day (TID) | ORAL | 0 refills | Status: DC

## 2019-11-05 MED ORDER — HYDROXYZINE HCL 25 MG PO TABS: 25.0000 mg | ORAL_TABLET | Freq: Three times a day (TID) | ORAL | 0 refills | Status: DC | PRN

## 2019-11-05 MED ORDER — TEMAZEPAM 15 MG PO CAPS: 15.0000 mg | ORAL_CAPSULE | Freq: Every day | ORAL | 0 refills | Status: DC

## 2019-11-05 MED ORDER — RISPERIDONE 2 MG PO TABS: 2.0000 mg | ORAL_TABLET | Freq: Two times a day (BID) | ORAL | 0 refills | Status: DC

## 2019-11-05 MED ORDER — AMOXICILLIN-POT CLAVULANATE 875-125 MG PO TABS: 1.0000 | ORAL_TABLET | Freq: Two times a day (BID) | ORAL | 0 refills | Status: DC

## 2019-11-05 NOTE — Progress Notes (Signed)
D:  Patient's self inventory sheet, patient has fair sleep, sleep medication helpful.  Fair appetite, normal.  Energy level, poor concentration.  Rated depression 6, hopeless 4, anxiety 7.  Denied withdrawals.  Physical problems, patient has felt lightheaded, headaches, rash, blurred vision.  Swollen gum, L side and discolored.  Needs medicine for her skin.  Head, lower back, L/R buttocks.  Pain medicine helpful.  Goal is shelter after discharge.  Plan is anything.  Question discharge.  No discharge plans. A:  Medications administered per MD orders.  Emotional support and encouragement given patient. R:  Patient denied SI and HI, contracts for safety.  Denied A/V hallucinations.  Safety maintained with 15 minute checks.
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
## 2019-11-05 NOTE — Plan of Care (Signed)
Nurse discussed anxiety, depression and coping skills with patient.  

## 2019-11-05 NOTE — Progress Notes (Signed)
Patient stated she needs bleaching cream for skin problem related to the reason why she is here, something is outputting out of her skin.  Does have scratches on her buttocks and back.  Washes her hair and her body daily.  She stated she looks like a cheeta.

## 2019-11-05 NOTE — Progress Notes (Signed)
Recreation Therapy Notes  Animal-Assisted Activity (AAA) Program Checklist/Progress Notes Patient Eligibility Criteria Checklist & Daily Group note for Rec Tx Intervention  Date: 12.15.20 Time: 68 Location: [address]   AAA/T Program Assumption of Risk Form signed by Teacher, music or Parent Legal Guardian  YES   Patient is free of allergies or sever asthma  YES   Patient reports no fear of animals  YES   Patient reports no history of cruelty to animals  YES   Patient understands his/her participation is voluntary  YES  Patient washes hands before animal contact  YES   Patient washes hands after animal contact  YES   Education: Contractor, Appropriate Animal Interaction   Education Outcome: Acknowledges understanding/In group clarification offered/Needs additional education.   Clinical Observations/Feedback:  Pt did not attend group activity.    Victorino Sparrow, LRT/CTRS         Victorino Sparrow A 11/05/2019 3:34 PM

## 2019-11-05 NOTE — Discharge Summary (Signed)
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 She reports prior hospitalizations for similar symptoms. Denies history of suicide attempts. Held for observation in August 2020 for similar symptoms and discharged on Risperdal and Neurontin.  Past Medical History:  Past Medical History:  Diagnosis Date  . Bipolar disorder ([place])   . Depression   . Fatty liver   . Heart murmur   . PTSD (post-traumatic stress disorder)    History reviewed. No pertinent surgical history. Family History: History reviewed. No pertinent family history. Family Psychiatric  History: Denies Social History:  Social History   Substance and Sexual Activity  Alcohol Use Yes  . Alcohol/week: 2.0 standard drinks  . Types: 2 Cans of beer per week   Comment: Pt reports she drinks "light beer as a diuretic when her foot is swollen"     Social History   Substance and Sexual Activity  Drug Use Yes  . Types: Cocaine, Amphetamines, Marijuana   Comment: last use last month on 08/06/2019 assessment    Social History   Socioeconomic History  . Marital status: Single    Spouse name: Not on file  . Number of children: Not on file  . Years of education: Not on file  . Highest education level: Not on file  Occupational History  . Not on file  Tobacco Use  . Smoking status: Current Every Day Smoker    Packs/day: 0.50    Types:  Cigarettes  . Smokeless tobacco: Never Used  Substance and Sexual Activity  . Alcohol use: Yes    Alcohol/week: 2.0 standard drinks    Types: 2 Cans of beer per week    Comment: Pt reports she drinks "light beer as a diuretic when her foot is swollen"  . Drug use: Yes    Types: Cocaine, Amphetamines, Marijuana    Comment: last use last month on 08/06/2019 assessment  . Sexual activity: Yes    Birth control/protection: Condom  Other Topics Concern  . Not on file  Social History Narrative  . Not on file   Social Determinants of Health   Financial Resource Strain:   . Difficulty of Paying Living Expenses: Not on file  Food Insecurity:   . Worried About Charity fundraiser in the Last Year: Not on file  . Ran Out of Food in the Last Year: Not on file  Transportation Needs:   . Lack of Transportation (Medical): Not on file  . Lack of Transportation (Non-Medical): Not on file  Physical Activity:   . Days of Exercise per Week: Not on file  . Minutes of Exercise per Session: Not on file  Stress:   . Feeling of Stress : Not on file  Social Connections:   . Frequency of Communication with Friends and Family: Not on file  . Frequency of Social Gatherings with Friends and Family: Not on file  . Attends Religious Services: Not on file  . Active  Member of Clubs or Organizations: Not on file  . Attends BankerClub or Organization Meetings: Not on file  . Marital Status: Not on file    Hospital Course:  From admission H&P: Diane Saunders is a 43 year old female with a history of bipolar disorder, presenting for somatic delusions. She reports her back has been hurting, and bugs inside her have been punching holes through her back and crawling down to her backside- "it's like I have a butt implant." She reports that she went to the doctor for a pap smear, and they had to open up the window because there were bugs coming out everywhere. She reports "They have footage [facility] of the bugs  flying out of my legs. They were all squirting out." She has been taking frequent baths to try to eliminate the bugs. She reports difficulty with completing daily activities because of the bugs. She is homeless and had been staying in a hotel recently but ran out of money. She has taken psychotropic medications in the past but is unable to recall names of past medications. She admits to past history of cocaine use but states last use was in August of 2020. UDS negative. BAL negative. She denies SI/HI/AVH.  Diane Saunders was admitted for somatic delusions as described above. [facility] She was started on Risperdal, Cogentin, Neurontin, PRN Vistaril, and Restoril. She participated in group therapy on the unit. She responded well to treatment with no adverse effects reported. She is less focused on somatic delusions and states "the bugs aren't bothering me anymore." Speech is more organized and logical. She denies any SI/HI/AVH and contracts for safety. She is discharging on the medications listed below. She agrees to follow [facility] at [facility] (see below). Patient is provided with prescriptions for medications upon discharge. She is discharging via Lyft [facility].  Physical Findings: AIMS: Facial and Oral Movements Muscles of Facial Expression: None, normal Lips and Perioral Area: None, normal Jaw: None, normal Tongue: None, normal,Extremity Movements Upper (arms, wrists, hands, fingers): None, normal Lower (legs, knees, ankles, toes): None, normal, Trunk Movements Neck, shoulders, hips: None, normal, Overall Severity Severity of abnormal movements (highest score from questions above): None, normal Incapacitation due to abnormal movements: None, normal Patient's awareness of abnormal movements (rate only patient's report): No Awareness, Dental Status Current problems with teeth and/or dentures?: No Does patient usually wear dentures?: No  CIWA:  CIWA-Ar Total:  1 COWS:  COWS Total Score: 1  Musculoskeletal: Strength & Muscle Tone: within normal limits Gait & Station: normal Patient leans: N/A  Psychiatric Specialty Exam: Physical Exam  Nursing note and vitals reviewed. Constitutional: She is oriented to person, place, and time. She appears well-developed and well-nourished.  Cardiovascular: Normal rate.  Respiratory: Effort normal.  Neurological: She is alert and oriented to person, place, and time.    Review of Systems  Constitutional: Negative.   Respiratory: Negative for cough and shortness of breath.   Psychiatric/Behavioral: Negative for agitation, behavioral problems, dysphoric mood, self-injury, sleep disturbance and suicidal ideas. The patient is not nervous/anxious and is not hyperactive.     Blood pressure 125/77, pulse 73, temperature 98 F (36.7 C), temperature source Oral, resp. rate 18, height 5\' 6"  (1.676 m), weight 81.6 kg, last menstrual period 10/11/2019, SpO2 100 %.Body mass index is 29.05 kg/m.  See MD's discharge SRA    Have you used any form of tobacco in the last 30 days? (Cigarettes, Smokeless  Tobacco, Cigars, and/or Pipes): Yes  Has this patient used any form of tobacco in the last 30 days? (Cigarettes, Smokeless Tobacco, Cigars, and/or Pipes)  No  Blood Alcohol level:  Lab Results  Component Value Date   ETH <10 10/30/2019   ETH <10 10/28/2019    Metabolic Disorder Labs:  Lab Results  Component Value Date   HGBA1C 5.6 11/03/2019   MPG 114.02 11/03/2019   No results found for: PROLACTIN Lab Results  Component Value Date   CHOL 206 (H) 11/03/2019   TRIG 106 11/03/2019   HDL 55 11/03/2019   CHOLHDL 3.7 11/03/2019   VLDL 21 11/03/2019   LDLCALC 130 (H) 11/03/2019    See Psychiatric Specialty Exam and Suicide Risk Assessment completed by Attending Physician prior to discharge.  Discharge destination:  Other:  [facility]  Is patient on multiple antipsychotic therapies at discharge:  No   Has Patient  had three or more failed trials of antipsychotic monotherapy by history:  No  Recommended Plan for Multiple Antipsychotic Therapies: NA  Discharge Instructions    Discharge instructions   Complete by: As directed    Patient is instructed to take all prescribed medications as recommended. Report any side effects or adverse reactions to your outpatient psychiatrist. Patient is instructed to abstain from alcohol and illegal drugs while on prescription medications. In the event of worsening symptoms, patient is instructed to call the crisis hotline, 911, or go to the nearest emergency department for evaluation and treatment.     Allergies as of 11/05/2019      Reactions   Mushroom Extract Complex Itching      Medication List    TAKE these medications     Indication  amoxicillin-clavulanate 875-125 MG tablet Commonly known as: AUGMENTIN Take 1 tablet by mouth every 12 (twelve) hours.  Indication: Tooth infection   benzocaine 10 % mucosal gel Commonly known as: ORAJEL Use as directed in the mouth or throat 4 (four) times daily as needed for mouth pain.  Indication: Tooth pain   benztropine 0.5 MG tablet Commonly known as: COGENTIN Take 1 tablet (0.5 mg total) by mouth 2 (two) times daily.  Indication: Extrapyramidal Reaction caused by Medications   gabapentin 300 MG capsule Commonly known as: NEURONTIN Take 1 capsule (300 mg total) by mouth 3 (three) times daily.  Indication: Neuropathic Pain   hydrOXYzine 25 MG tablet Commonly known as: ATARAX/VISTARIL Take 1 tablet (25 mg total) by mouth 3 (three) times daily as needed for anxiety.  Indication: Feeling Anxious   risperiDONE 2 MG tablet Commonly known as: RISPERDAL Take 1 tablet (2 mg total) by mouth 2 (two) times daily.  Indication: Psychosis   temazepam 15 MG capsule Commonly known as: RESTORIL Take 1 capsule (15 mg total) by mouth at bedtime.  Indication: Trouble Sleeping      Follow-up [facility]. Go on 11/12/2019.   Why: Your appointment is 11/12/19 at 10:15am with Dr. Mauricia Area. Please be sure to bring any discharge paperwork, including your list of medications.  Contact information: [address] [place] [address] [phone]           Follow-up recommendations: Activity as tolerated. Diet as recommended by primary care physician. Keep all scheduled follow-up appointments as recommended.   Comments:   Patient is instructed to take all prescribed medications as recommended. Report any side effects or adverse reactions to your outpatient psychiatrist. Patient is instructed to abstain from alcohol and illegal drugs while  on prescription medications. In the event of worsening symptoms, patient is instructed to call the crisis hotline, 911, or go to the nearest emergency department for evaluation and treatment.  Signed: Aldean Baker, NP 11/05/2019, 1:54 PM

## 2019-11-05 NOTE — BHH Suicide Risk Assessment (Signed)
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  All other systems reviewed and are negative.   Blood pressure 125/77, pulse 73, temperature 98 F (36.7 C), temperature source Oral, resp. rate 18, height 5\' 6"  (1.676 m), weight 81.6 kg, last menstrual period 10/11/2019, SpO2 100 %.Body mass index is 29.05 kg/m.  General Appearance: Casual  Eye Contact::  Good  Speech:  Normal Rate409  Volume:  Normal  Mood:  Euthymic  Affect:  Congruent  Thought Process:  Coherent and Descriptions of Associations: Loose  Orientation:  Full (Time, Place, and Person)  Thought Content:  Delusions  Suicidal Thoughts:  No  Homicidal Thoughts:  No  Memory:  Immediate;   Fair Recent;   Fair Remote;   Fair  Judgement:  Intact  Insight:  Fair  Psychomotor Activity:  Normal  Concentration:  Fair  Recall:  [employer] of Knowledge:Good  Language: NA  Akathisia:  Negative  Handed:  Right  AIMS (if indicated):     Assets:  Desire for Improvement Resilience  Sleep:  Number of Hours: 6.5  Cognition: WNL  ADL's:  Intact   Mental Status Per Nursing Assessment::   On Admission:  NA  Demographic Factors:  Low socioeconomic status, Living alone and Unemployed  Loss Factors: Financial problems/change in socioeconomic status  Historical Factors: Impulsivity  Risk Reduction Factors:   Positive coping skills or problem solving skills  Continued Clinical Symptoms:  Severe Anxiety and/or Agitation More than one psychiatric diagnosis Currently Psychotic  Cognitive Features That Contribute To Risk:   None    Suicide Risk:  Minimal: No identifiable suicidal ideation.  Patients presenting with no risk factors but with morbid ruminations; may be classified as minimal risk based on the severity of the depressive symptoms  Follow-up Information    [facility]. Go on 11/12/2019.   Why: Your appointment is 11/12/19 at 10:15am with Dr. Gerrie Nordmann. Please be sure to bring any discharge paperwork, including your list of medications.  Contact information: [address] [place] [address] [phone]           Plan Of Care/Follow-up recommendations:  Activity:  ad lib  Sharma Covert, MD 11/05/2019, 1:45 PM

## 2019-11-05 NOTE — Progress Notes (Signed)
Discharge Note:  Patient discharged via lyft.  Patient denied SI and HI.  Denied A/V hallucinations.  Suicide prevention information given and discussed with patient who stated she understood and had no questions.  Patient stated she received all her belongings, clothing, toiletries, misc items, etc.  Patient stated she appreciated all assistance received from [facility] staff.  All required discharge information given to patient at discharge.

## 2019-11-25 ENCOUNTER — Other Ambulatory Visit: Payer: Self-pay

## 2019-11-25 ENCOUNTER — Emergency Department (HOSPITAL_COMMUNITY)
Admission: EM | Admit: 2019-11-25 | Discharge: 2019-11-25 | Discharge status: Against Medical Advice | Payer: Medicaid Other | Attending: Emergency Medicine | Admitting: Emergency Medicine

## 2019-11-25 ENCOUNTER — Encounter (HOSPITAL_COMMUNITY): Payer: Self-pay | Admitting: *Deleted

## 2019-11-25 DIAGNOSIS — Z79899 Other long term (current) drug therapy: Secondary | ICD-10-CM | POA: Diagnosis not present

## 2019-11-25 DIAGNOSIS — B852 Pediculosis, unspecified: Secondary | ICD-10-CM | POA: Diagnosis not present

## 2019-11-25 DIAGNOSIS — L299 Pruritus, unspecified: Secondary | ICD-10-CM | POA: Diagnosis not present

## 2019-11-25 DIAGNOSIS — F319 Bipolar disorder, unspecified: Secondary | ICD-10-CM | POA: Diagnosis not present

## 2019-11-25 DIAGNOSIS — R21 Rash and other nonspecific skin eruption: Secondary | ICD-10-CM | POA: Diagnosis present

## 2019-11-25 DIAGNOSIS — Z87891 Personal history of nicotine dependence: Secondary | ICD-10-CM | POA: Diagnosis not present

## 2019-11-25 LAB — HIV ANTIBODY (ROUTINE TESTING W REFLEX): HIV Screen 4th Generation wRfx: NONREACTIVE

## 2019-11-25 MED ORDER — HYDROXYZINE HCL 10 MG PO TABS: 10.0000 mg | ORAL_TABLET | Freq: Three times a day (TID) | ORAL | 0 refills | Status: DC | PRN

## 2019-11-25 MED ORDER — PERMETHRIN 5 % EX CREA: TOPICAL_CREAM | CUTANEOUS | 0 refills | Status: DC

## 2019-11-25 NOTE — ED Provider Notes (Signed)
[facility] EMERGENCY DEPARTMENT Provider Note   CSN: [id number] Arrival date & time: 11/25/19  1315     History Chief Complaint  Patient presents with  . Rash    Diane Saunders is a 44 y.o. female with a hx of bipolar disorder, depression, PTSD & cocaine abuse who presents to the ED with complaints of concern for bugs in her scalp and throughout her body for several years. She states small white bugs are "coming out of her." They are itchy. No alleviating/aggravating factors. She is currently on her period and noted the bugs there as well. She is concerned they are parasites.  She has gotten treatment for this before and would like to be treated again.  Denies fever, chills, nausea, vomiting, auditory hallucinations, suicidal ideations, or homicidal ideations.  Denies dyspnea, wheezing, or throat closing. No new products. She states she has seen psychiatry for this problem in the past and does not wish to again.    HPI     Past Medical History:  Diagnosis Date  . Bipolar disorder ([facility])   . Depression   . Fatty liver   . Heart murmur   . PTSD (post-traumatic stress disorder)     Patient Active Problem List   Diagnosis Date Noted  . Delusional disorder, somatic type ([facility]) 10/31/2019  . Delusional disorder ([facility]) 06/27/2019  . Cocaine abuse with cocaine-induced psychotic disorder, with delusions ([facility]) 06/27/2019  . Bipolar 1 disorder ([facility]) 06/27/2019    History reviewed. No pertinent surgical history.   OB History   No obstetric history on file.     No family history on file.  Social History   Tobacco Use  . Smoking status: Former Smoker    Packs/day: 0.50    Types: Cigarettes  . Smokeless tobacco: Never Used  Substance Use Topics  . Alcohol use: Yes    Alcohol/week: 2.0 standard drinks    Types: 2 Cans of beer per week    Comment: Pt reports she drinks "light beer as a diuretic when her foot is swollen"  . Drug use: Yes    Types: Cocaine,  Amphetamines, Marijuana    Comment: last use last month on 08/06/2019 assessment    Home Medications Prior to Admission medications   Medication Sig Start Date End Date Taking? Authorizing Provider  amoxicillin-clavulanate (AUGMENTIN) 875-125 MG tablet Take 1 tablet by mouth every 12 (twelve) hours. 11/05/19   Aldean Baker, NP  benzocaine (ORAJEL) 10 % mucosal gel Use as directed in the mouth or throat 4 (four) times daily as needed for mouth pain. 11/05/19   Aldean Baker, NP  benztropine (COGENTIN) 0.5 MG tablet Take 1 tablet (0.5 mg total) by mouth 2 (two) times daily. 11/05/19   Aldean Baker, NP  gabapentin (NEURONTIN) 300 MG capsule Take 1 capsule (300 mg total) by mouth 3 (three) times daily. 11/05/19   Aldean Baker, NP  hydrOXYzine (ATARAX/VISTARIL) 25 MG tablet Take 1 tablet (25 mg total) by mouth 3 (three) times daily as needed for anxiety. 11/05/19   Aldean Baker, NP  risperiDONE (RISPERDAL) 2 MG tablet Take 1 tablet (2 mg total) by mouth 2 (two) times daily. 11/05/19   Aldean Baker, NP  temazepam (RESTORIL) 15 MG capsule Take 1 capsule (15 mg total) by mouth at bedtime. 11/05/19   Aldean Baker, NP    Allergies    Mushroom extract complex  Review of Systems   Review of Systems  Constitutional:  Negative for chills and fever.  HENT: Negative for trouble swallowing and voice change.   Respiratory: Negative for cough and shortness of breath.   Cardiovascular: Negative for chest pain.  Gastrointestinal: Negative for nausea and vomiting.  Skin: Positive for rash.       Positive for pruritus and concern for bus.     Physical Exam Updated Vital Signs BP 97/78 (BP Location: Right Arm)   Pulse 68   Temp 98.6 F (37 C) (Oral)   Resp 14   Ht 5\' 8"  (1.727 m)   Wt 127 kg   LMP 10/11/2019   SpO2 100%   BMI 42.57 kg/m   Physical Exam Vitals and nursing note reviewed.  Constitutional:      General: She is not in acute distress.    Appearance: She is  well-developed. She is not toxic-appearing.  HENT:     Head: Normocephalic and atraumatic.     Mouth/Throat:     Comments: Posterior oropharynx is symmetric appearing. Patient tolerating own secretions without difficulty. No trismus. No drooling. No hot potato voice. No swelling beneath the tongue, submandibular compartment is soft.  Eyes:     General:        Right eye: No discharge.        Left eye: No discharge.     Conjunctiva/sclera: Conjunctivae normal.  Cardiovascular:     Rate and Rhythm: Normal rate and regular rhythm.  Pulmonary:     Effort: Pulmonary effort is normal. No respiratory distress.     Breath sounds: Normal breath sounds. No wheezing, rhonchi or rales.  Abdominal:     General: There is no distension.     Palpations: Abdomen is soft.     Tenderness: There is no abdominal tenderness. There is no guarding or rebound.  Musculoskeletal:     Cervical back: Neck supple.  Skin:    General: Skin is warm and dry.     Findings: No rash.     Comments: No obvious acute rash.  No urticaria.  No pustules, papules, vesicles, or purpura.  No palm/sole lesions. Patient does have small nits noted in the scalp and to the pubic area.  Chaperone was present for GU exam.  Patient declined to have pelvic exam performed.  Neurological:     Mental Status: She is alert.     Comments: Clear speech.   Psychiatric:        Behavior: Behavior normal.     ED Results / Procedures / Treatments   Labs (all labs ordered are listed, but only abnormal results are displayed) Labs Reviewed  RPR  HIV ANTIBODY (ROUTINE TESTING W REFLEX)    EKG None  Radiology No results found.  Procedures Procedures (including critical care time)  Medications Ordered in ED Medications - No data to display  ED Course  I have reviewed the triage vital signs and the nursing notes.  Pertinent labs & imaging results that were available during my care of the patient were reviewed by me and considered in  my medical decision making (see chart for details).    MDM Rules/Calculators/A&P                      Patient presents with complaints of several years of bugs on her body.  She has been seen previously for similar with psychiatric consultations.  She states that she does not feel this is a psychiatric problem and is declining psychiatric evaluation..  She denies SI or  HI.  No indication for IVC.  She does have small nits to the scalp and to the pubic area-we will treat with permethrin as well as Atarax as needed.  She has no identifiable rash.  She also is requesting to be checked for HIV specifically, will check for this as well as syphilis, however no rashes or history components to raise concern for this specifically.  I offered pelvic exam to evaluate for gonorrhea and chlamydia, patient refused. PCP follow-up. I discussed results, treatment plan, need for follow-up, and return precautions with the patient. Provided opportunity for questions, patient confirmed understanding and is in agreement with plan. Findings and plan of care discussed with supervising physician Dr. Roslynn Amble who is in agreement.    Final Clinical Impression(s) / ED Diagnoses Final diagnoses:  Lice    Rx / DC Orders ED Discharge Orders         Ordered    permethrin (ELIMITE) 5 % cream     11/25/19 2043    hydrOXYzine (ATARAX/VISTARIL) 10 MG tablet  3 times daily PRN     11/25/19 2043           Amaryllis Dyke, PA-C 11/25/19 2053    Lucrezia [facility], MD 11/27/19 1317

## 2019-11-25 NOTE — ED Triage Notes (Signed)
Pt here with same complaint as before states feels white bugs all over and they jump everywhere and they like blood and she is on her period , has a bag with bloody tissues  In it that she shows  To me states they are eating her ovaries and  Pop out at the worse times

## 2019-11-25 NOTE — ED Notes (Signed)
Patient verbalizes understanding of discharge instructions. Opportunity for questioning and answers were provided. Armband removed by staff, pt discharged from ED. Pt. ambulatory and discharged home.  

## 2019-11-25 NOTE — Discharge Instructions (Addendum)
You were seen in the emergency department today for concern for parasites.  We are sending home with permethrin, please apply this as directed.  You may repeat in 2 weeks should your symptoms persist.  We are also sending home with Atarax to take as needed for itching.  We have prescribed you new medication(s) today. Discuss the medications prescribed today with your pharmacist as they can have adverse effects and interactions with your other medicines including over the counter and prescribed medications. Seek medical evaluation if you start to experience new or abnormal symptoms after taking one of these medicines, seek care immediately if you start to experience difficulty breathing, feeling of your throat closing, facial swelling, or rash as these could be indications of a more serious allergic reaction  Please follow-up with your primary care provider within 1 week for reevaluation.  Return to the ER for new or worsening symptoms or any other concerns

## 2019-11-25 NOTE — ED Notes (Signed)
Pt has been walking in and out of the lobby, Pt was called for triage with no answer and was moved off the floor. Pt came back asking if her name had been called.

## 2019-11-26 LAB — RPR: RPR Ser Ql: NONREACTIVE

## 2019-11-27 ENCOUNTER — Emergency Department (HOSPITAL_COMMUNITY)
Admission: EM | Admit: 2019-11-27 | Discharge: 2019-11-27 | Discharge status: Against Medical Advice | Payer: Medicaid Other

## 2019-11-27 NOTE — ED Notes (Signed)
Called pt for triage, no answer x2 

## 2019-11-27 NOTE — ED Notes (Signed)
Called pt for triage, no answer x3 °

## 2020-01-01 ENCOUNTER — Ambulatory Visit: Payer: Medicaid Other | Admitting: Critical Care Medicine

## 2020-01-01 ENCOUNTER — Other Ambulatory Visit: Payer: Self-pay

## 2020-01-01 VITALS — BP 103/76 | HR 76 | Temp 97.0°F

## 2020-01-01 DIAGNOSIS — F22 Delusional disorders: Secondary | ICD-10-CM

## 2020-01-01 DIAGNOSIS — K0889 Other specified disorders of teeth and supporting structures: Secondary | ICD-10-CM

## 2020-01-01 DIAGNOSIS — B850 Pediculosis due to Pediculus humanus capitis: Secondary | ICD-10-CM

## 2020-01-01 MED ORDER — TEMAZEPAM 15 MG PO CAPS: 15.0000 mg | ORAL_CAPSULE | Freq: Every day | ORAL | 0 refills | Status: AC

## 2020-01-01 MED ORDER — PERMETHRIN 5 % EX CREA: TOPICAL_CREAM | CUTANEOUS | 0 refills | Status: AC

## 2020-01-01 MED ORDER — IBUPROFEN 600 MG PO TABS: 600.0000 mg | ORAL_TABLET | Freq: Three times a day (TID) | ORAL | 0 refills | Status: AC | PRN

## 2020-01-01 MED ORDER — BENZOCAINE 10 % MT GEL: Freq: Four times a day (QID) | OROMUCOSAL | 0 refills | Status: AC | PRN

## 2020-01-01 MED ORDER — BENZTROPINE MESYLATE 0.5 MG PO TABS: 0.5000 mg | ORAL_TABLET | Freq: Every day | ORAL | 0 refills | Status: AC

## 2020-01-01 MED ORDER — RISPERIDONE 2 MG PO TABS: 2.0000 mg | ORAL_TABLET | Freq: Every day | ORAL | 0 refills | Status: AC

## 2020-01-01 NOTE — Progress Notes (Signed)
Subjective:    Patient ID: Diane Saunders, female    DOB: [birth year], 44 y.o.   MRN: [id number]  This is a 44 year old female who comes to the mobile medicine clinic for concerns related to oral pain and scalp irritation along with upper back skin irritation.  Patient does have history of delusional disorder bipolar disorder.  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  The patient was in the ER in early January with same concerns and was found to have head lice but no other abnormality seen  The patient had been previously hospitalized in early December at the behavioral health hospital for severe delusional thinking.  The patient also had a history of cocaine use producing more delusional ideation.  During that admission the patient was prescribed twice daily Cogentin and Risperdal along with terminal Restoril for sleep  Patient complains of tooth pain in her left upper jaw there is been an ongoing concern.  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 She reports prior hospitalizations for similar symptoms. Denies history of suicide attempts. Held for observation in August 2020 for similar symptoms and discharged on Risperdal and Neurontin.  Past Medical History:  Past Medical History: Diagnosis Date . Bipolar disorder ([place])  . Depression  . Fatty liver  . Heart murmur  . PTSD (post-traumatic stress disorder)   History reviewed. No pertinent surgical history. Family History: History reviewed. No pertinent family history. Family Psychiatric  History: Denies Social History:  Social  History  Substance and Sexual Activity Alcohol Use Yes . Alcohol/week: 2.0 standard drinks . Types: 2 Cans of beer per week  Comment: Pt reports she drinks "light beer as a diuretic when her foot is swollen"    Social History  Substance and Sexual Activity Drug Use Yes . Types: Cocaine, Amphetamines, Marijuana  Comment: last use last month on 08/06/2019 assessment   Social History  Socioeconomic History . Marital status: Single   Spouse name: Not on file . Number of children: Not on file . Years of education: Not on file . Highest education level: Not on file Occupational History . Not on file Tobacco Use . Smoking status: Current Every Day Smoker   Packs/day: 0.50   Types: Cigarettes . Smokeless tobacco: Never Used Substance and Sexual Activity . Alcohol use: Yes   Alcohol/week: 2.0 standard drinks   Types: 2 Cans of beer per week   Comment: Pt reports she drinks "light beer as a diuretic when her foot is swollen" . Drug use: Yes   Types: Cocaine, Amphetamines, Marijuana   Comment: last use last month on 08/06/2019 assessment . Sexual activity: Yes   Birth control/protection: Condom Other Topics Concern . Not on file Social History Narrative . Not on file  Social Determinants of Health  Financial Resource Strain:  . Difficulty of Paying Living Expenses: Not on file Food Insecurity:  . Worried About Charity fundraiser in the Last Year: Not on file . Ran Out of Food in the Last Year: Not on file Transportation Needs:  . Lack of Transportation (Medical): Not on file . Lack of Transportation (Non-Medical): Not on file Physical Activity:  .  Days of Exercise per Week: Not on file . Minutes of Exercise per Session: Not on file Stress:  . Feeling of Stress : Not on file Social Connections:  . Frequency of Communication with Friends and Family: Not on file . Frequency of Social Gatherings with Friends and Family: Not on file . Attends  Religious Services: Not on file . Active Member of Clubs or Organizations: Not on file . Attends Banker Meetings: Not on file . Marital Status: Not on file   Hospital Course:  From admission H&P: Diane Saunders is a 44 year old female with a history of bipolar disorder, presenting for somatic delusions. She reports her back has been hurting, and bugs inside her have been punching holes through her back and crawling down to her backside- "it's like I have a butt implant." She reports that she went to the doctor for a pap smear, and they had to open up the window because there were bugs coming out everywhere. She reports "They have footage in the [facility] of the bugs flying out of my legs. They were all squirting out." She has been taking frequent baths to try to eliminate the bugs. She reports difficulty with completing daily activities because of the bugs. She is homeless and had been staying in a hotel recently but ran out of money. She has taken psychotropic medications in the past but is unable to recall names of past medications. She admits to past history of cocaine use but states last use was in August of 2020. UDS negative. BAL negative. She denies SI/HI/AVH.  Diane Saunders was admitted for somatic delusions as described above. She remained on the [facility] unit forfivedays. She was started on Risperdal, Cogentin, Neurontin, PRN Vistaril, and Restoril. She participated in group therapy on the unit. She responded well to treatment with no adverse effects reported. She is less focused on somatic delusions and states "the bugs aren't bothering me anymore." Speech is more organized and logical. She denies any SI/HI/AVH and contracts for safety. She is discharging on the medications listed below. [facility] Patient is provided with prescriptions for medications upon discharge. She is discharging via Lyft to the [facility].  Since discharge the  patient has had a visit with her mental health provider at [facility] however she has not been taking any of her medications.  She states she feels they make her very drowsy and she is unable to engage in her daytime activities.  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  She is staying in the [facility] and has been at the shelter for 1 month   Past Medical History:  Diagnosis Date  . Bipolar disorder (HCC)   . Depression   . Fatty liver   . Heart murmur   . PTSD (post-traumatic stress disorder)      History reviewed. No pertinent family history.   Social History   Socioeconomic History  . Marital status: Single    Spouse name: Not on file  . Number of children: Not on file  . Years of education: Not on file  . Highest education level: Not on file  Occupational History  . Not on file  Tobacco Use  . Smoking status: Former Smoker    Packs/day: 0.50    Types: Cigarettes  . Smokeless tobacco: Never Used  Substance and Sexual Activity  . Alcohol use: Yes    Alcohol/week: 2.0 standard drinks    Types:  2 Cans of beer per week    Comment: Pt reports she drinks "light beer as a diuretic when her foot is swollen"  . Drug use: Yes    Types: Cocaine, Amphetamines, Marijuana    Comment: last use last month on 08/06/2019 assessment  . Sexual activity: Yes    Birth control/protection: Condom  Other Topics Concern  . Not on file  Social History Narrative  . Not on file   Social Determinants of Health   Financial Resource Strain:   . Difficulty of Paying Living Expenses: Not on file  Food Insecurity:   . Worried About Programme researcher, broadcasting/film/video in the Last Year: Not on file  . Ran Out of Food in the Last Year: Not on file  Transportation Needs:   . Lack of Transportation (Medical): Not on file  . Lack of Transportation (Non-Medical): Not on file  Physical Activity:   . Days of Exercise per Week: Not on file  . Minutes of Exercise per Session: Not on file  Stress:   .  Feeling of Stress : Not on file  Social Connections:   . Frequency of Communication with Friends and Family: Not on file  . Frequency of Social Gatherings with Friends and Family: Not on file  . Attends Religious Services: Not on file  . Active Member of Clubs or Organizations: Not on file  . Attends Banker Meetings: Not on file  . Marital Status: Not on file  Intimate Partner Violence:   . Fear of Current or Ex-Partner: Not on file  . Emotionally Abused: Not on file  . Physically Abused: Not on file  . Sexually Abused: Not on file     Allergies  Allergen Reactions  . Mushroom Extract Complex Itching     Outpatient Medications Prior to Visit  Medication Sig Dispense Refill  . amoxicillin-clavulanate (AUGMENTIN) 875-125 MG tablet Take 1 tablet by mouth every 12 (twelve) hours. (Patient not taking: Reported on 01/01/2020) 11 tablet 0  . benzocaine (ORAJEL) 10 % mucosal gel Use as directed in the mouth or throat 4 (four) times daily as needed for mouth pain. (Patient not taking: Reported on 01/01/2020) 5.3 g 0  . benztropine (COGENTIN) 0.5 MG tablet Take 1 tablet (0.5 mg total) by mouth 2 (two) times daily. (Patient not taking: Reported on 01/01/2020) 60 tablet 0  . gabapentin (NEURONTIN) 300 MG capsule Take 1 capsule (300 mg total) by mouth 3 (three) times daily. (Patient not taking: Reported on 01/01/2020) 90 capsule 0  . hydrOXYzine (ATARAX/VISTARIL) 10 MG tablet Take 1 tablet (10 mg total) by mouth 3 (three) times daily as needed for itching. (Patient not taking: Reported on 01/01/2020) 15 tablet 0  . permethrin (ELIMITE) 5 % cream Thoroughly massage cream (30 g for average adult- 1/2 of cream) from head to soles of feet; leave on for 8 to 14 hours before removing (shower or bath); may repeat if living mites are observed 14 days after first treatment; one application is generally curative. (Patient not taking: Reported on 01/01/2020) 60 g 0  . risperiDONE (RISPERDAL) 2 MG tablet  Take 1 tablet (2 mg total) by mouth 2 (two) times daily. (Patient not taking: Reported on 01/01/2020) 60 tablet 0  . temazepam (RESTORIL) 15 MG capsule Take 1 capsule (15 mg total) by mouth at bedtime. (Patient not taking: Reported on 01/01/2020) 15 capsule 0   No facility-administered medications prior to visit.    Review of Systems Constitutional:   No  weight loss, night sweats,  Fevers, chills, fatigue, lassitude. HEENT:   No headaches,  Difficulty swallowing,  Tooth/dental problems,  Sore throat,                No sneezing, itching, ear ache, nasal congestion, post nasal drip,   CV:  No chest pain,  Orthopnea, PND, swelling in lower extremities, anasarca, dizziness, palpitations  GI  No heartburn, indigestion, abdominal pain, nausea, vomiting, diarrhea, change in bowel habits, loss of appetite  Resp: No shortness of breath with exertion or at rest.  No excess mucus, no productive cough,  No non-productive cough,  No coughing up of blood.  No change in color of mucus.  No wheezing.  No chest wall deformity  Skin: scalp irritation, .  GU: no dysuria, change in color of urine, no urgency or frequency.  No flank pain.  MS:  No joint pain or swelling.  No decreased range of motion.  No back pain.  Psych:  No change in mood or affect. No depression or anxiety.  No memory loss.  Delusional thought     Objective:   Physical Exam Vitals:   01/01/20 1244  BP: 103/76  Pulse: 76  Temp: (!) 97 F (36.1 C)    Gen: Pleasant, significant delusional thought is exhibited , well-nourished, in no distress,    ENT: No lesions,  mouth left upper gum tender, first molar with tooth decay, periodontal disease,  oropharynx clear, no postnasal drip  Neck: No JVD, no TMG, no carotid bruits:     Lungs: No use of accessory muscles, no dullness to percussion, clear without rales or rhonchi  Cardiovascular: RRR, heart sounds normal, no murmur or gallops, no peripheral edema  Abdomen: soft and NT, no  HSM,  BS normal  Musculoskeletal: No deformities, no cyanosis or clubbing  Neuro: alert, non focal  Skin: Warm, no abnormalities on upper back Nits from head lice seen on scalp    Assessment & Plan:  I personally reviewed all images and lab data in the [facility] system as well as any outside material available during this office visit and agree with the  radiology impressions.   Tooth pain Left upper jaw demonstrates significant periodontal disease and tooth decay of the first molar  There does not appear to be an active tooth abscess  Plan will be to prescribe oral gel mucosal gel for the pain and ibuprofen 600 mg 3 times daily as needed for tooth pain  Both with the patient will be able to achieve dental care  Head lice Significant head lice with nits seen in the hair follicles  Prescribe another round of permethrin cream 5%  There is no abnormality seen on the upper back  Delusional disorder, somatic type (HCC) Delusional disorder somatic type with evidence of this today on exam  Plan for this will be the patient to resume risperdal 2 mg nightly and begin Cogentin 0.5 mg nightly also give the patient 15 mg of Restoril for sleep  She is encouraged to keep her follow-up appointments with [facility] for further medication titrations   Diagnoses and all orders for this visit:  Tooth pain  Head lice  Delusional disorder, somatic type (HCC)  Other orders -     permethrin (ELIMITE) 5 % cream; Thoroughly massage cream (30 g for average adult- 1/2 of cream) from head to soles of feet; leave on for 8 to 14 hours before removing (shower or bath); may repeat if living mites are observed 14  days after first treatment; one application is generally curative. -     benzocaine (ORAJEL) 10 % mucosal gel; Use as directed in the mouth or throat 4 (four) times daily as needed for mouth pain. -     benztropine (COGENTIN) 0.5 MG tablet; Take 1 tablet (0.5 mg total) by mouth at bedtime. -      risperiDONE (RISPERDAL) 2 MG tablet; Take 1 tablet (2 mg total) by mouth at bedtime. -     temazepam (RESTORIL) 15 MG capsule; Take 1 capsule (15 mg total) by mouth at bedtime. -     ibuprofen (ADVIL) 600 MG tablet; Take 1 tablet (600 mg total) by mouth every 8 (eight) hours as needed.

## 2020-01-01 NOTE — Patient Instructions (Signed)
Begin Cogentin 1 tablet at bedtime, Risperdal 1 tablet at bedtime, mirtazapine 1 tablet at bedtime  Use the Orajel apply this in your mouth to your affected to use 2-3 times a day as needed  Use ibuprofen 600 mg 3 times a day as needed for discomfort and your pain in your gum  Begin permethrin cream 5% apply this to your scalp and also to your back as needed for your skin irritation and parasitic condition  Please keep your upcoming appointments with a new primary care physician and also with [facility] for follow-up

## 2020-01-02 DIAGNOSIS — B850 Pediculosis due to Pediculus humanus capitis: Secondary | ICD-10-CM | POA: Insufficient documentation

## 2020-01-02 DIAGNOSIS — K0889 Other specified disorders of teeth and supporting structures: Secondary | ICD-10-CM | POA: Insufficient documentation

## 2020-01-02 NOTE — Assessment & Plan Note (Signed)
Delusional disorder somatic type with evidence of this today on exam  Plan for this will be the patient to resume risperdal 2 mg nightly and begin Cogentin 0.5 mg nightly also give the patient 15 mg of Restoril for sleep  She is encouraged to keep her follow-up appointments with [facility] for further medication titrations

## 2020-01-02 NOTE — Assessment & Plan Note (Signed)
Significant head lice with nits seen in the hair follicles  Prescribe another round of permethrin cream 5%  There is no abnormality seen on the upper back

## 2020-01-02 NOTE — Assessment & Plan Note (Signed)
Left upper jaw demonstrates significant periodontal disease and tooth decay of the first molar  There does not appear to be an active tooth abscess  Plan will be to prescribe oral gel mucosal gel for the pain and ibuprofen 600 mg 3 times daily as needed for tooth pain  Both with the patient will be able to achieve dental care

## 2020-09-08 ENCOUNTER — Ambulatory Visit: Payer: Medicaid Other

## 2020-09-29 ENCOUNTER — Ambulatory Visit: Payer: Self-pay

## 2021-02-26 IMAGING — CR DG CHEST 2V
2 series · 2 of 2 positions shown · non-contrast
Comparison: 12/16/2004

CLINICAL DATA: Chest pain

EXAM:
CHEST - 2 VIEW

[chest pa]
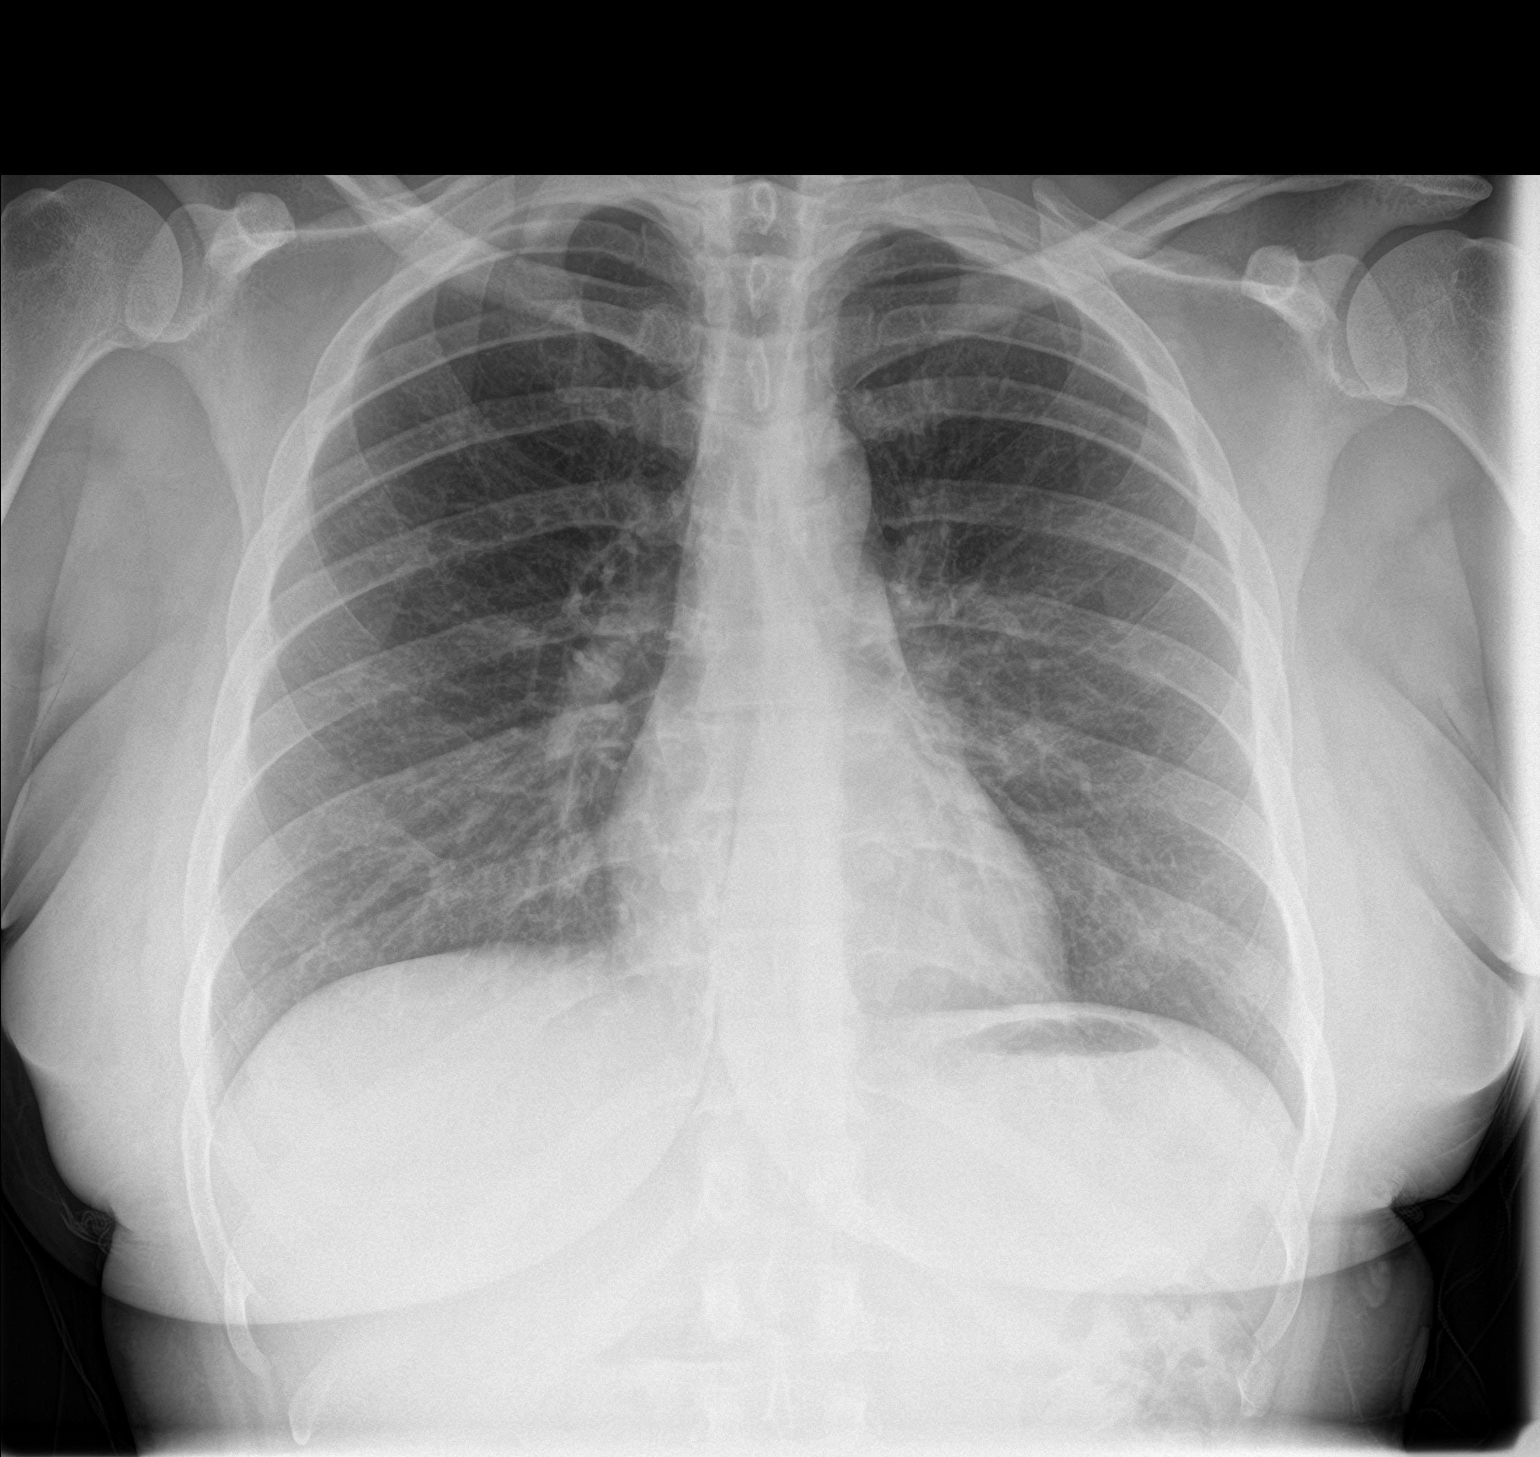

[chest lat]
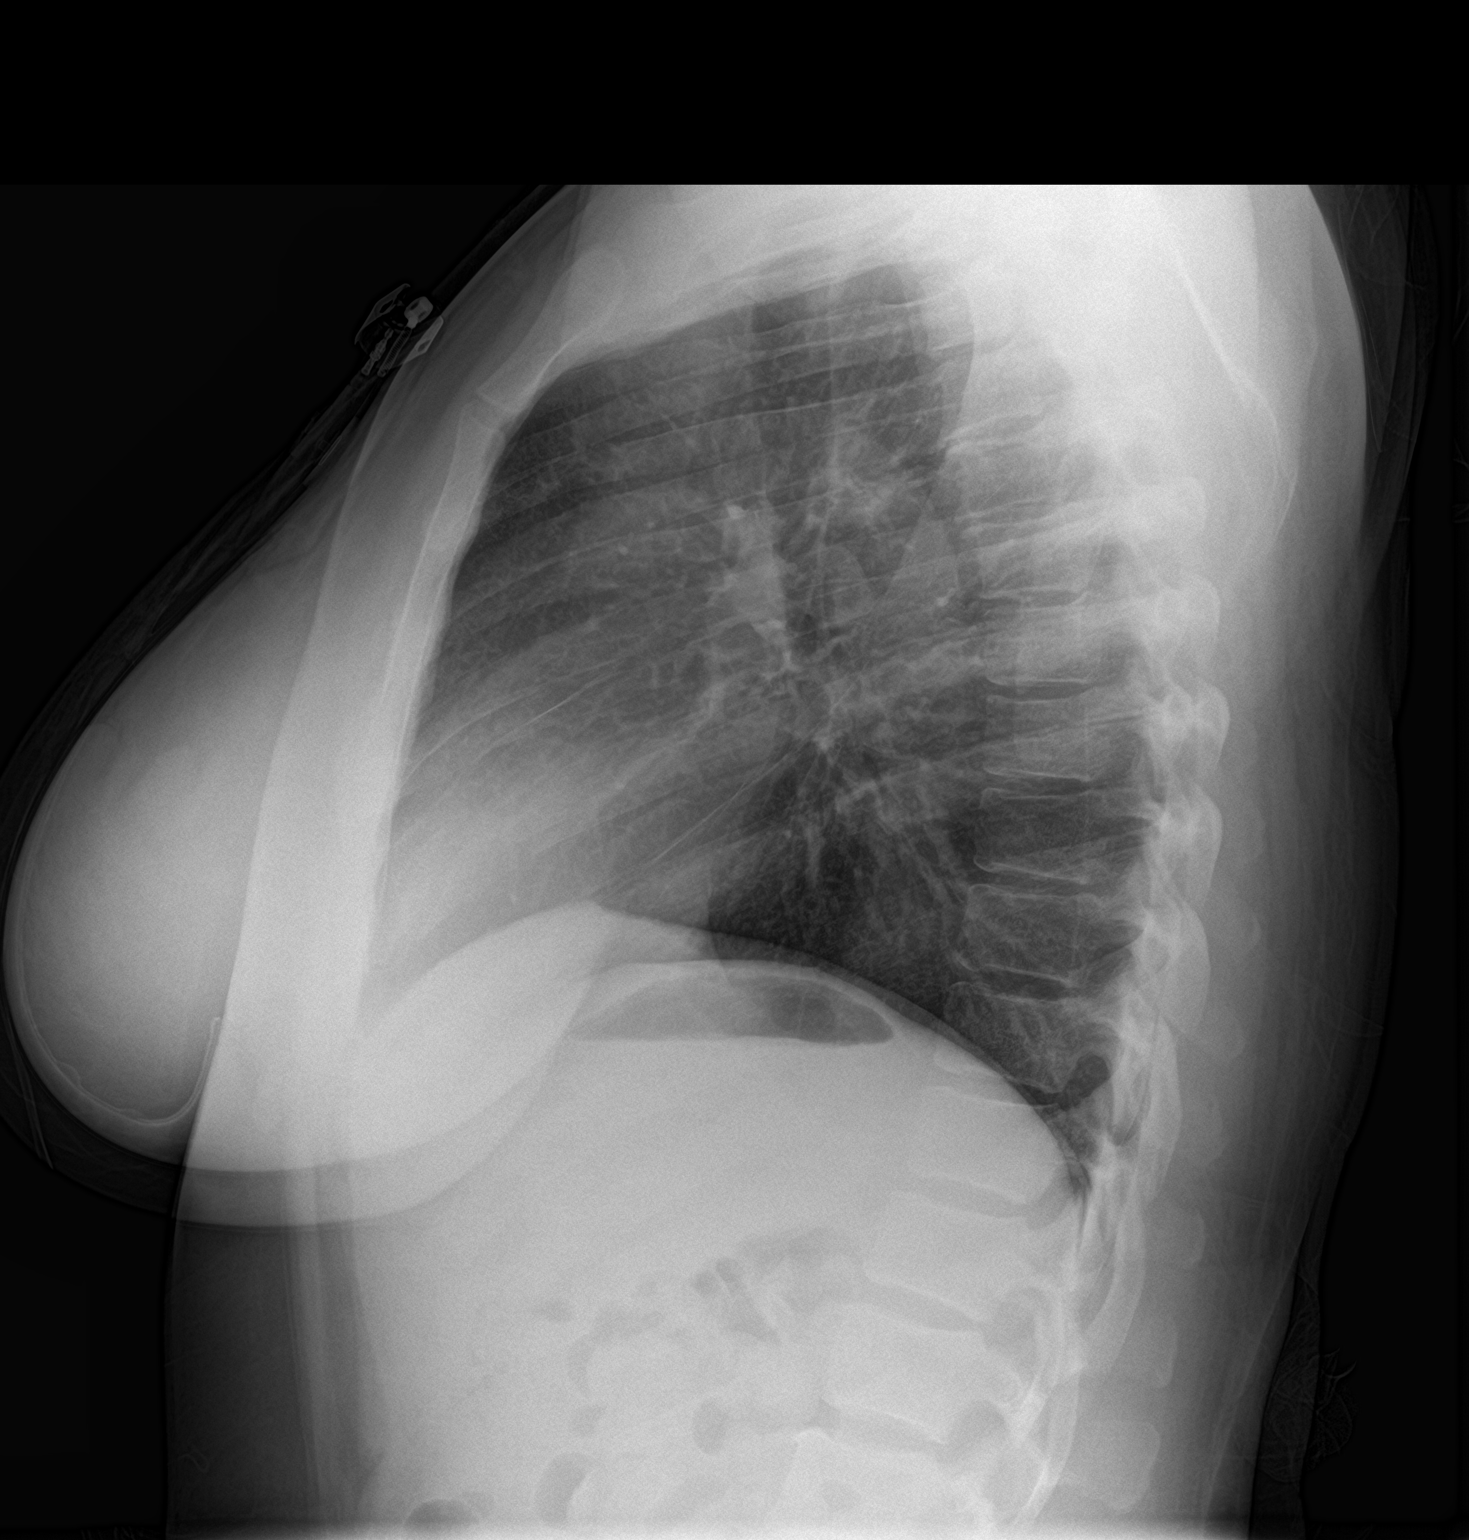

[2 of 2 positions shown; findings below may reference images not displayed]

FINDINGS: The heart size and mediastinal contours are within normal limits.
Both lungs are clear. The visualized skeletal structures are
unremarkable.
IMPRESSION: No active cardiopulmonary disease.
